# Patient Record
Sex: Female | Born: 1935 | Race: White | Hispanic: No | Smoking: Former smoker
Health system: Southern US, Community
[De-identification: ages and names within clinical notes are randomized; demographics above are authoritative.]

## PROBLEM LIST (undated history)

## (undated) DIAGNOSIS — K219 Gastro-esophageal reflux disease without esophagitis: Secondary | ICD-10-CM

## (undated) DIAGNOSIS — I10 Essential (primary) hypertension: Secondary | ICD-10-CM

## (undated) DIAGNOSIS — F0390 Unspecified dementia without behavioral disturbance: Secondary | ICD-10-CM

## (undated) DIAGNOSIS — I639 Cerebral infarction, unspecified: Secondary | ICD-10-CM

## (undated) DIAGNOSIS — J449 Chronic obstructive pulmonary disease, unspecified: Secondary | ICD-10-CM

## (undated) DIAGNOSIS — I2510 Atherosclerotic heart disease of native coronary artery without angina pectoris: Secondary | ICD-10-CM

## (undated) HISTORY — PX: CARDIAC DEFIBRILLATOR PLACEMENT: SHX171

## (undated) HISTORY — PX: CATARACT EXTRACTION: SUR2

## (undated) HISTORY — PX: A-V CARDIAC PACEMAKER INSERTION: SHX562

## (undated) HISTORY — PX: JOINT REPLACEMENT: SHX530

---

## 2011-10-18 ENCOUNTER — Emergency Department (INDEPENDENT_AMBULATORY_CARE_PROVIDER_SITE_OTHER): Payer: Medicare Other

## 2011-10-18 ENCOUNTER — Emergency Department (HOSPITAL_BASED_OUTPATIENT_CLINIC_OR_DEPARTMENT_OTHER)
Admission: EM | Admit: 2011-10-18 | Discharge: 2011-10-18 | Disposition: A | Discharge status: Other Acute Inpatient Hospital | Payer: Medicare Other | Attending: Emergency Medicine | Admitting: Emergency Medicine

## 2011-10-18 ENCOUNTER — Encounter (HOSPITAL_BASED_OUTPATIENT_CLINIC_OR_DEPARTMENT_OTHER): Payer: Self-pay | Admitting: *Deleted

## 2011-10-18 DIAGNOSIS — R0609 Other forms of dyspnea: Secondary | ICD-10-CM | POA: Insufficient documentation

## 2011-10-18 DIAGNOSIS — R0602 Shortness of breath: Secondary | ICD-10-CM

## 2011-10-18 DIAGNOSIS — Z950 Presence of cardiac pacemaker: Secondary | ICD-10-CM | POA: Insufficient documentation

## 2011-10-18 DIAGNOSIS — J4489 Other specified chronic obstructive pulmonary disease: Secondary | ICD-10-CM | POA: Insufficient documentation

## 2011-10-18 DIAGNOSIS — J449 Chronic obstructive pulmonary disease, unspecified: Secondary | ICD-10-CM | POA: Insufficient documentation

## 2011-10-18 DIAGNOSIS — Z8673 Personal history of transient ischemic attack (TIA), and cerebral infarction without residual deficits: Secondary | ICD-10-CM | POA: Insufficient documentation

## 2011-10-18 DIAGNOSIS — R079 Chest pain, unspecified: Secondary | ICD-10-CM

## 2011-10-18 DIAGNOSIS — I2510 Atherosclerotic heart disease of native coronary artery without angina pectoris: Secondary | ICD-10-CM | POA: Insufficient documentation

## 2011-10-18 DIAGNOSIS — R55 Syncope and collapse: Secondary | ICD-10-CM | POA: Insufficient documentation

## 2011-10-18 DIAGNOSIS — R0989 Other specified symptoms and signs involving the circulatory and respiratory systems: Secondary | ICD-10-CM | POA: Insufficient documentation

## 2011-10-18 DIAGNOSIS — J438 Other emphysema: Secondary | ICD-10-CM

## 2011-10-18 DIAGNOSIS — I10 Essential (primary) hypertension: Secondary | ICD-10-CM | POA: Insufficient documentation

## 2011-10-18 DIAGNOSIS — I517 Cardiomegaly: Secondary | ICD-10-CM

## 2011-10-18 HISTORY — DX: Atherosclerotic heart disease of native coronary artery without angina pectoris: I25.10

## 2011-10-18 HISTORY — DX: Gastro-esophageal reflux disease without esophagitis: K21.9

## 2011-10-18 HISTORY — DX: Chronic obstructive pulmonary disease, unspecified: J44.9

## 2011-10-18 HISTORY — DX: Essential (primary) hypertension: I10

## 2011-10-18 HISTORY — DX: Cerebral infarction, unspecified: I63.9

## 2011-10-18 HISTORY — DX: Unspecified dementia, unspecified severity, without behavioral disturbance, psychotic disturbance, mood disturbance, and anxiety: F03.90

## 2011-10-18 LAB — COMPREHENSIVE METABOLIC PANEL
ALT: 11 U/L (ref 0–35)
AST: 22 U/L (ref 0–37)
Alkaline Phosphatase: 114 U/L (ref 39–117)
CO2: 26 mEq/L (ref 19–32)
Calcium: 9.4 mg/dL (ref 8.4–10.5)
Chloride: 104 mEq/L (ref 96–112)
GFR calc non Af Amer: 78 mL/min — ABNORMAL LOW (ref >90)
Glucose, Bld: 112 mg/dL — ABNORMAL HIGH (ref 70–99)
Potassium: 3.9 mEq/L (ref 3.5–5.1)
Sodium: 141 mEq/L (ref 135–145)

## 2011-10-18 LAB — CBC
Hemoglobin: 14.6 g/dL (ref 12.0–15.0)
MCH: 32.1 pg (ref 26.0–34.0)
Platelets: 149 10*3/uL — ABNORMAL LOW (ref 150–400)
RBC: 4.55 MIL/uL (ref 3.87–5.11)
WBC: 7.6 10*3/uL (ref 4.0–10.5)

## 2011-10-18 NOTE — ED Notes (Signed)
Report given to [employer] RN HP

## 2011-10-18 NOTE — ED Notes (Signed)
Patient has mild dementia.  Patient and son states patient developed intermittent chest pain "some time during the night".  Hx of cardiac disease.  Son states that the patient is on medications, but forgets to take on a regular basis.  Denies nausea, sweating or radiation of her pain.  States when she takes a deep breath, her pain increases.  Pt has hx of copd.

## 2011-10-18 NOTE — ED Provider Notes (Signed)
History     CSN: [id number]  Arrival date & time 10/18/11  1228   First MD Initiated Contact with Patient 10/18/11 1311      Chief Complaint  Patient presents with  . Chest Pain    (Consider location/radiation/quality/duration/timing/severity/associated sxs/prior treatment) Patient is a 78 y.o. female presenting with chest pain. The history is provided by the patient. No language interpreter was used.  Chest Pain The chest pain began 6 - 12 hours ago. Chest pain occurs intermittently. The chest pain is resolved. The pain is associated with breathing. At its most intense, the pain is at 6/10. The pain is currently at 6/10. The quality of the pain is described as aching. The pain does not radiate. Pertinent negatives for primary symptoms include no shortness of breath.  Associated symptoms include near-syncope. She tried nothing for the symptoms.  Her past medical history is significant for arrhythmia, hypertension and pacemaker.   Pt reports pain began last night.  Pt reports pain has continued.  No current pain  Past Medical History  Diagnosis Date  . Coronary artery disease   . Hypertension   . GERD (gastroesophageal reflux disease)   . COPD (chronic obstructive pulmonary disease)   . Stroke   . Dementia     Past Surgical History  Procedure Date  . Joint replacement   . Cataract extraction   . A-v cardiac pacemaker insertion   . Cardiac defibrillator placement     No family history on file.  History  Substance Use Topics  . Smoking status: Former Games developer  . Smokeless tobacco: Not on file  . Alcohol Use: No    OB History    Grav Para Term Preterm Abortions TAB SAB Ect Mult Living                  Review of Systems  Respiratory: Negative for shortness of breath.   Cardiovascular: Positive for chest pain and near-syncope.  All other systems reviewed and are negative.    Allergies  Review of patient's allergies indicates no known allergies.  Home  Medications   Current Outpatient Rx  Name Route Sig Dispense Refill  . AMLODIPINE BESYLATE 5 MG PO TABS Oral Take 5 mg by mouth daily.    . NEBIVOLOL HCL 5 MG PO TABS Oral Take 5 mg by mouth daily.    Marland Kitchen OMEPRAZOLE 20 MG PO CPDR Oral Take 20 mg by mouth daily.      BP 158/90  Pulse 70  Temp 98.2 F (36.8 C)  Resp 20  Ht 5\' 6"  (1.676 m)  Wt 160 lb (72.576 kg)  BMI 25.82 kg/m2  SpO2 92%  Physical Exam  Nursing note and vitals reviewed. Constitutional: She is oriented to person, place, and time. She appears well-developed and well-nourished.  HENT:  Head: Normocephalic.  Right Ear: External ear normal.  Mouth/Throat: Oropharynx is clear and moist.  Eyes: Conjunctivae and EOM are normal. Pupils are equal, round, and reactive to light.  Neck: Normal range of motion. Neck supple.  Cardiovascular: Normal rate and normal heart sounds.   Pulmonary/Chest: Effort normal.  Abdominal: Soft.  Musculoskeletal: Normal range of motion.  Neurological: She is alert and oriented to person, place, and time. She has normal reflexes.  Skin: Skin is warm.  Psychiatric: She has a normal mood and affect.    ED Course  Procedures (including critical care time)  Labs Reviewed  CBC - Abnormal; Notable for the following:    Platelets 149 (*)  All other components within normal limits  COMPREHENSIVE METABOLIC PANEL - Abnormal; Notable for the following:    Glucose, Bld 112 (*)    GFR calc non Af Amer 78 (*)    All other components within normal limits  TROPONIN I   No results found.   No diagnosis found.    MDM   Results for orders placed during the hospital encounter of 10/18/11  CBC      Component Value Range   WBC 7.6  4.0 - 10.5 (K/uL)   RBC 4.55  3.87 - 5.11 (MIL/uL)   Hemoglobin 14.6  12.0 - 15.0 (g/dL)   HCT 16.1  09.6 - 04.5 (%)   MCV 96.0  78.0 - 100.0 (fL)   MCH 32.1  26.0 - 34.0 (pg)   MCHC 33.4  30.0 - 36.0 (g/dL)   RDW 40.9  81.1 - 91.4 (%)   Platelets 149 (*) 150  - 400 (K/uL)  COMPREHENSIVE METABOLIC PANEL      Component Value Range   Sodium 141  135 - 145 (mEq/L)   Potassium 3.9  3.5 - 5.1 (mEq/L)   Chloride 104  96 - 112 (mEq/L)   CO2 26  19 - 32 (mEq/L)   Glucose, Bld 112 (*) 70 - 99 (mg/dL)   BUN 10  6 - 23 (mg/dL)   Creatinine, Ser 7.82  0.50 - 1.10 (mg/dL)   Calcium 9.4  8.4 - 95.6 (mg/dL)   Total Protein 7.1  6.0 - 8.3 (g/dL)   Albumin 3.8  3.5 - 5.2 (g/dL)   AST 22  0 - 37 (U/L)   ALT 11  0 - 35 (U/L)   Alkaline Phosphatase 114  39 - 117 (U/L)   Total Bilirubin 0.5  0.3 - 1.2 (mg/dL)   GFR calc non Af Amer 78 (*) >90 (mL/min)   GFR calc Af Amer >90  >90 (mL/min)  TROPONIN I      Component Value Range   Troponin I <0.30  <0.30 (ng/mL)  PRO B NATRIURETIC PEPTIDE      Component Value Range   Pro B Natriuretic peptide (BNP) 370.0  0 - 450 (pg/mL)   Dg Chest 2 View  10/18/2011  *RADIOLOGY REPORT*  Clinical Data: Chest pain.  Shortness of breath.  CHEST - 2 VIEW  Comparison: None available.  Findings: The heart is enlarged.  Moderate emphysema is noted. Elevation of the left hemidiaphragm is likely chronic.  No focal airspace disease is evident.  A superior endplate compression fracture of the upper lumbar spine is age indeterminate.  There appears to be sclerosis associated with retrolisthesis.  This is likely remote.  Degenerative changes are worse in the left than right shoulder.  The visualized soft tissues and bony thorax are otherwise unremarkable.  IMPRESSION: 1.  Cardiomegaly without failure. 2.  Moderate emphysema. 3.  No acute cardiopulmonary disease. 4.  Superior endplate compression fracture of L1 or L2 is likely remote.  Please see the above discussion.  Original Report Authenticated By: Jamesetta Orleans. MATTERN, M.D.    Date: 10/18/2011  Rate: 91  Rhythm: normal sinus rhythm  QRS Axis: normal  Intervals: normal  ST/T Wave abnormalities: normal  Conduction Disutrbances:none  Narrative Interpretation:   Old EKG Reviewed: none  available    I spoke to Dr. Maisie Fus with [facility] who will admit at [facility].   Lonia Skinner [place], [place] 10/18/11 1535

## 2011-10-23 NOTE — ED Provider Notes (Signed)
History/physical exam/procedure(s) were performed by non-physician practitioner and as supervising physician I was immediately available for consultation/collaboration. I have reviewed all notes and am in agreement with care and plan.   Hilario Quarry, MD 10/23/11 [phone]

## 2013-11-22 ENCOUNTER — Emergency Department (HOSPITAL_BASED_OUTPATIENT_CLINIC_OR_DEPARTMENT_OTHER): Payer: Medicare Other

## 2013-11-22 ENCOUNTER — Emergency Department (HOSPITAL_BASED_OUTPATIENT_CLINIC_OR_DEPARTMENT_OTHER)
Admission: EM | Admit: 2013-11-22 | Discharge: 2013-11-22 | Disposition: A | Discharge status: Other Acute Inpatient Hospital | Payer: Medicare Other | Attending: Emergency Medicine | Admitting: Emergency Medicine

## 2013-11-22 ENCOUNTER — Encounter (HOSPITAL_BASED_OUTPATIENT_CLINIC_OR_DEPARTMENT_OTHER): Payer: Self-pay | Admitting: Emergency Medicine

## 2013-11-22 DIAGNOSIS — Z95810 Presence of automatic (implantable) cardiac defibrillator: Secondary | ICD-10-CM | POA: Insufficient documentation

## 2013-11-22 DIAGNOSIS — Z8673 Personal history of transient ischemic attack (TIA), and cerebral infarction without residual deficits: Secondary | ICD-10-CM | POA: Insufficient documentation

## 2013-11-22 DIAGNOSIS — Y9289 Other specified places as the place of occurrence of the external cause: Secondary | ICD-10-CM | POA: Insufficient documentation

## 2013-11-22 DIAGNOSIS — Z87891 Personal history of nicotine dependence: Secondary | ICD-10-CM | POA: Insufficient documentation

## 2013-11-22 DIAGNOSIS — R778 Other specified abnormalities of plasma proteins: Secondary | ICD-10-CM

## 2013-11-22 DIAGNOSIS — IMO0002 Reserved for concepts with insufficient information to code with codable children: Secondary | ICD-10-CM | POA: Insufficient documentation

## 2013-11-22 DIAGNOSIS — Z950 Presence of cardiac pacemaker: Secondary | ICD-10-CM | POA: Insufficient documentation

## 2013-11-22 DIAGNOSIS — Z79899 Other long term (current) drug therapy: Secondary | ICD-10-CM | POA: Insufficient documentation

## 2013-11-22 DIAGNOSIS — W19XXXA Unspecified fall, initial encounter: Secondary | ICD-10-CM

## 2013-11-22 DIAGNOSIS — J449 Chronic obstructive pulmonary disease, unspecified: Secondary | ICD-10-CM | POA: Insufficient documentation

## 2013-11-22 DIAGNOSIS — K219 Gastro-esophageal reflux disease without esophagitis: Secondary | ICD-10-CM | POA: Insufficient documentation

## 2013-11-22 DIAGNOSIS — I10 Essential (primary) hypertension: Secondary | ICD-10-CM | POA: Insufficient documentation

## 2013-11-22 DIAGNOSIS — S12110A Anterior displaced Type II dens fracture, initial encounter for closed fracture: Secondary | ICD-10-CM

## 2013-11-22 DIAGNOSIS — Y9389 Activity, other specified: Secondary | ICD-10-CM | POA: Insufficient documentation

## 2013-11-22 DIAGNOSIS — S129XXA Fracture of neck, unspecified, initial encounter: Secondary | ICD-10-CM | POA: Insufficient documentation

## 2013-11-22 DIAGNOSIS — R7989 Other specified abnormal findings of blood chemistry: Secondary | ICD-10-CM | POA: Insufficient documentation

## 2013-11-22 DIAGNOSIS — J4489 Other specified chronic obstructive pulmonary disease: Secondary | ICD-10-CM | POA: Insufficient documentation

## 2013-11-22 DIAGNOSIS — R51 Headache: Secondary | ICD-10-CM | POA: Insufficient documentation

## 2013-11-22 DIAGNOSIS — W010XXA Fall on same level from slipping, tripping and stumbling without subsequent striking against object, initial encounter: Secondary | ICD-10-CM | POA: Insufficient documentation

## 2013-11-22 DIAGNOSIS — I2510 Atherosclerotic heart disease of native coronary artery without angina pectoris: Secondary | ICD-10-CM | POA: Insufficient documentation

## 2013-11-22 DIAGNOSIS — F0390 Unspecified dementia without behavioral disturbance: Secondary | ICD-10-CM | POA: Insufficient documentation

## 2013-11-22 LAB — COMPREHENSIVE METABOLIC PANEL
ALBUMIN: 3.8 g/dL (ref 3.5–5.2)
ALK PHOS: 94 U/L (ref 39–117)
ALT: 22 U/L (ref 0–35)
AST: 42 U/L — AB (ref 0–37)
BUN: 14 mg/dL (ref 6–23)
CALCIUM: 9.9 mg/dL (ref 8.4–10.5)
CO2: 25 mEq/L (ref 19–32)
Chloride: 104 mEq/L (ref 96–112)
Creatinine, Ser: 0.7 mg/dL (ref 0.50–1.10)
GFR calc Af Amer: 89 mL/min — ABNORMAL LOW (ref >90)
GFR calc non Af Amer: 77 mL/min — ABNORMAL LOW (ref >90)
GLUCOSE: 129 mg/dL — AB (ref 70–99)
POTASSIUM: 4.2 meq/L (ref 3.7–5.3)
SODIUM: 144 meq/L (ref 137–147)
TOTAL PROTEIN: 7 g/dL (ref 6.0–8.3)
Total Bilirubin: 1.7 mg/dL — ABNORMAL HIGH (ref 0.3–1.2)

## 2013-11-22 LAB — TROPONIN I: Troponin I: 0.33 ng/mL (ref <0.30)

## 2013-11-22 LAB — CBC WITH DIFFERENTIAL/PLATELET
BASOS PCT: 0 % (ref 0–1)
Basophils Absolute: 0 10*3/uL (ref 0.0–0.1)
EOS ABS: 0 10*3/uL (ref 0.0–0.7)
Eosinophils Relative: 0 % (ref 0–5)
HCT: 45.4 % (ref 36.0–46.0)
HEMOGLOBIN: 15.6 g/dL — AB (ref 12.0–15.0)
LYMPHS PCT: 4 % — AB (ref 12–46)
Lymphs Abs: 0.4 10*3/uL — ABNORMAL LOW (ref 0.7–4.0)
MCH: 34.4 pg — ABNORMAL HIGH (ref 26.0–34.0)
MCHC: 34.4 g/dL (ref 30.0–36.0)
MCV: 100.2 fL — ABNORMAL HIGH (ref 78.0–100.0)
Monocytes Absolute: 0.5 10*3/uL (ref 0.1–1.0)
Monocytes Relative: 5 % (ref 3–12)
NEUTROS PCT: 91 % — AB (ref 43–77)
Neutro Abs: 9.1 10*3/uL — ABNORMAL HIGH (ref 1.7–7.7)
PLATELETS: DECREASED 10*3/uL (ref 150–400)
RBC: 4.53 MIL/uL (ref 3.87–5.11)
RDW: 14.2 % (ref 11.5–15.5)
WBC: 10 10*3/uL (ref 4.0–10.5)

## 2013-11-22 LAB — CK: Total CK: 850 U/L — ABNORMAL HIGH (ref 7–177)

## 2013-11-22 MED ORDER — SODIUM CHLORIDE 0.9 % IV BOLUS (SEPSIS)
1000.0000 mL | Freq: Once | INTRAVENOUS | Status: AC
  Administered 2013-11-22: 1000 mL via INTRAVENOUS

## 2013-11-22 NOTE — ED Provider Notes (Signed)
CSN: [id number]     Arrival date & time 11/22/13  1701 History  This chart was scribed for Glynn Octave, MD by Shari Heritage, ED Scribe. The patient was seen in room MH01/MH01. Patient's care was started at 5:14 PM.    Chief Complaint  Patient presents with  . Fall    The history is provided by the patient. No language interpreter was used.    HPI Comments: Donna Dalton is a 80 y.o. female who presents to the Emergency Department from her assisted living facility via EMS complaining of an unwitnessed fall that occurred sometime within the last 1 day. Patient's son states that she was found on the floor earlier today in her room and nursing facility staff suspect she had been there for several hours. Patient states that she tripped and fell while trying to get into bed, but was unable to get back up. She is not sure whether she hit her head or not. Per son, patient did not call for help, and staff found her while they were making rounds. Prior to arrival, she was complaining of left shoulder soreness and mild sacral pain to her son. Son states that patient's current mental status is her baseline. Son further reports that in the last week he has noticed patient's coordination has been poorer than usual. Currently, patient denies chest pain, abdominal pain, hip pain, back pain, extremity, vomiting, diarrhea or other symptoms. Patient has a medical history of CVA, dementia, hypertension, coronary artery disease and COPD. Patient has had a cardiac defibrillator and pacemaker for the past 20 years, currently [employer].  PCP - Doreatha Martin  Past Medical History  Diagnosis Date  . Coronary artery disease   . Hypertension   . GERD (gastroesophageal reflux disease)   . COPD (chronic obstructive pulmonary disease)   . Stroke   . Dementia    Past Surgical History  Procedure Laterality Date  . Joint replacement    . Cataract extraction    . A-v cardiac pacemaker insertion    . Cardiac  defibrillator placement     No family history on file. History  Substance Use Topics  . Smoking status: Former Games developer  . Smokeless tobacco: Not on file  . Alcohol Use: No   OB History   Grav Para Term Preterm Abortions TAB SAB Ect Mult Living                 Review of Systems A complete 10 system review of systems was obtained and all systems are negative except as noted in the HPI and PMH.    Allergies  Review of patient's allergies indicates no known allergies.  Home Medications   Prior to Admission medications   Medication Sig Start Date End Date Taking? Authorizing Provider  amLODipine (NORVASC) 5 MG tablet Take 5 mg by mouth daily.    Historical Provider, MD  nebivolol (BYSTOLIC) 5 MG tablet Take 5 mg by mouth daily.    Historical Provider, MD  omeprazole (PRILOSEC) 20 MG capsule Take 20 mg by mouth daily.    Historical Provider, MD   Triage Vitals: BP 154/75  Pulse 85  Temp(Src) 99.8 F (37.7 C) (Oral)  Resp 22  SpO2 90% Physical Exam  Nursing note and vitals reviewed. Constitutional: She appears well-developed and well-nourished. No distress.  HENT:  Head: Normocephalic and atraumatic.  Eyes: Conjunctivae and EOM are normal.  Neck: Normal range of motion. Neck supple. No tracheal deviation present.  Cardiovascular: Normal rate, regular rhythm and  normal heart sounds.  Exam reveals no gallop and no friction rub.   No murmur heard. Pulmonary/Chest: Effort normal and breath sounds normal. No respiratory distress. She has no wheezes. She has no rales.  Abdominal: Soft. There is no tenderness. There is no rebound and no guarding.  Musculoskeletal:  No TL spine tenderness. Paraspinal cervical tenderness.  Abrasion to left shin. Full ROM of hips without pain.   Neurological: She is alert.  No ataxia on finger to nose bilaterally. No pronator drift. 5/5 strength throughout. CN 2-12 intact. Negative Romberg. Equal grip strength. Sensation intact. Oriented x 2.    Skin: Skin is warm and dry.  Psychiatric: She has a normal mood and affect. Her behavior is normal.    ED Course  Procedures (including critical care time)  COORDINATION OF CARE: 5:32 PM- Will order x-ray of left shoulder, chest, pelvis and CTs of head and cervical spine; and CK UA, CBC with diff, CMP, and troponin. Patient informed of current plan for treatment and evaluation and agrees with plan at this time.  7:07 PM- Per [employer] representative, Vf that pace terminated on 09/19/13. VT nonsustained earlier in March, none recently, 4-20 beats no shocks.  7:12 PM - Spoke with Dr. Eden Emms, hospitalist on call, who has agreed to admit patient at [facility]    Results for orders placed during the hospital encounter of 11/22/13  CBC WITH DIFFERENTIAL      Result Value Ref Range   WBC 10.0  4.0 - 10.5 K/uL   RBC 4.53  3.87 - 5.11 MIL/uL   Hemoglobin 15.6 (*) 12.0 - 15.0 g/dL   HCT 29.5  62.1 - 30.8 %   MCV 100.2 (*) 78.0 - 100.0 fL   MCH 34.4 (*) 26.0 - 34.0 pg   MCHC 34.4  30.0 - 36.0 g/dL   RDW 65.7  84.6 - 96.2 %   Platelets PLATELETS APPEAR DECREASED  150 - 400 K/uL   Neutrophils Relative % 91 (*) 43 - 77 %   Lymphocytes Relative 4 (*) 12 - 46 %   Monocytes Relative 5  3 - 12 %   Eosinophils Relative 0  0 - 5 %   Basophils Relative 0  0 - 1 %   Neutro Abs 9.1 (*) 1.7 - 7.7 K/uL   Lymphs Abs 0.4 (*) 0.7 - 4.0 K/uL   Monocytes Absolute 0.5  0.1 - 1.0 K/uL   Eosinophils Absolute 0.0  0.0 - 0.7 K/uL   Basophils Absolute 0.0  0.0 - 0.1 K/uL   Smear Review LARGE PLATELETS PRESENT    COMPREHENSIVE METABOLIC PANEL      Result Value Ref Range   Sodium 144  137 - 147 mEq/L   Potassium 4.2  3.7 - 5.3 mEq/L   Chloride 104  96 - 112 mEq/L   CO2 25  19 - 32 mEq/L   Glucose, Bld 129 (*) 70 - 99 mg/dL   BUN 14  6 - 23 mg/dL   Creatinine, Ser 9.52  0.50 - 1.10 mg/dL   Calcium 9.9  8.4 - 84.1 mg/dL   Total Protein 7.0  6.0 - 8.3 g/dL   Albumin 3.8  3.5 - 5.2 g/dL    AST 42 (*) 0 - 37 U/L   ALT 22  0 - 35 U/L   Alkaline Phosphatase 94  39 - 117 U/L   Total Bilirubin 1.7 (*) 0.3 - 1.2 mg/dL   GFR calc non Af Amer 77 (*) >  90 mL/min   GFR calc Af Amer 89 (*) >90 mL/min  TROPONIN I      Result Value Ref Range   Troponin I 0.33 (*) <0.30 ng/mL  CK      Result Value Ref Range   Total CK 850 (*) 7 - 177 U/L    Imaging Review Dg Chest 1 View  11/22/2013   CLINICAL DATA:  Fall  EXAM: CHEST - 1 VIEW  COMPARISON:  10/18/2011  FINDINGS: Left-sided pacemaker overlies normal cardiac silhouette. Lungs are hyperinflated. There is pulmonary scarring at the lung apices. . Hiatal hernia noted.  IMPRESSION: 1.  No acute cardiopulmonary process.  . 2. Emphysema with upper lobe pulmonary scarring.  .   Electronically Signed   By: Genevive BiStewart  Edmunds M.D.   On: 11/22/2013 18:57   Dg Pelvis 1-2 Views  11/22/2013   CLINICAL DATA:  Fall  EXAM: PELVIS - 1-2 VIEW  COMPARISON:  None.  FINDINGS: Generalized osteopenia. No fracture or dislocation. Degenerative changes of bilateral SI joints and lower lumbar spine.  Sclerotic lesion in the left greater trochanter likely reflecting a bone island in the absence of known malignancy.  IMPRESSION: No acute osseous injury of the pelvis.   Electronically Signed   By: Elige KoHetal  Patel   On: 11/22/2013 18:42   Ct Head Wo Contrast  11/22/2013   ADDENDUM REPORT: 11/22/2013 19:46  ADDENDUM: Not mentioned in the initial report, there mild bilateral lung apical pleural/parenchymal irregular thickening, likely not of acute significance. It appears similar to prior CT scan of the thorax performed 08/28/12.   Electronically Signed   By: Esperanza Heiraymond  Rubner M.D.   On: 11/22/2013 19:46   11/22/2013   CLINICAL DATA:  Larey SeatFell last night, complaining of head pain and neck pain  EXAM: CT HEAD WITHOUT CONTRAST  CT CERVICAL SPINE WITHOUT CONTRAST  TECHNIQUE: Multidetector CT imaging of the head and cervical spine was performed following the standard protocol without intravenous  contrast. Multiplanar CT image reconstructions of the cervical spine were also generated.  COMPARISON:  None.  FINDINGS: CT HEAD FINDINGS  Moderate atrophy and low attenuation in the deep white matter consistent with chronic small vessel ischemic change. No evidence of mass or vascular territory infarct. No hydrocephalus. No hemorrhage or extra-axial fluid. Calvarium is intact.  CT CERVICAL SPINE FINDINGS  Levoscoliosis. Normal anterior-posterior alignment. 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 Mild degenerative disc disease at C3-4 and C4-5. Moderate to severe C5/6 and C6-7 degenerative disc disease. No prevertebral soft tissue swelling. There is a hairline fracture through the left side base of the odontoid process with no displacement.  IMPRESSION: 1. No acute intracranial abnormality 2. Hairline nondisplaced fracture through the odontoid process (type 2). Critical Value/emergent results were called by telephone at the time of interpretation on 11/22/2013 at 6:32 PM to Dr. Glynn OctaveSTEPHEN Deneshia Zucker , who verbally acknowledged these results.  Electronically Signed: By: Esperanza Heiraymond  Rubner M.D. On: 11/22/2013 18:32   Ct Cervical Spine Wo Contrast  11/22/2013   ADDENDUM REPORT: 11/22/2013 19:46  ADDENDUM: Not mentioned in the initial report, there mild bilateral lung apical pleural/parenchymal irregular thickening, likely not of acute significance. It appears similar to prior CT scan of the thorax performed 08/28/12.   Electronically Signed   By: Esperanza Heiraymond  Rubner M.D.   On: 11/22/2013 19:46   11/22/2013   CLINICAL DATA:  Larey SeatFell last night, complaining of head pain and neck pain  EXAM: CT HEAD WITHOUT CONTRAST  CT CERVICAL SPINE WITHOUT  CONTRAST  TECHNIQUE: Multidetector CT imaging of the head and cervical spine was performed following the standard protocol without intravenous contrast. Multiplanar CT image reconstructions of the cervical spine were also generated.  COMPARISON:  None.  FINDINGS: CT HEAD  FINDINGS  Moderate atrophy and low attenuation in the deep white matter consistent with chronic small vessel ischemic change. No evidence of mass or vascular territory infarct. No hydrocephalus. No hemorrhage or extra-axial fluid. Calvarium is intact.  CT CERVICAL SPINE FINDINGS  Levoscoliosis. Normal anterior-posterior alignment. 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 Mild degenerative disc disease at C3-4 and C4-5. Moderate to severe C5/6 and C6-7 degenerative disc disease. No prevertebral soft tissue swelling. There is a hairline fracture through the left side base of the odontoid process with no displacement.  IMPRESSION: 1. No acute intracranial abnormality 2. Hairline nondisplaced fracture through the odontoid process (type 2). Critical Value/emergent results were called by telephone at the time of interpretation on 11/22/2013 at 6:32 PM to Dr. Glynn Octave , who verbally acknowledged these results.  Electronically Signed: By: Esperanza Heir M.D. On: 11/22/2013 18:32   Dg Shoulder Left  11/22/2013   CLINICAL DATA:  Status post fall  EXAM: LEFT SHOULDER - 2+ VIEW  COMPARISON:  None.  FINDINGS: No fracture or dislocation. Mild degenerative changes of the glenohumeral joint and acromioclavicular joint. Small loose body in the axillary pouch of the glenohumeral joint.  IMPRESSION: No acute osseous injury of the left shoulder.   Electronically Signed   By: Elige Ko   On: 11/22/2013 18:44      EKG Interpretation   Date/Time:  Saturday Nov 22 2013 17:29:36 EDT Ventricular Rate:  79 PR Interval:  216 QRS Duration: 142 QT Interval:  460 QTC Calculation: 527 R Axis:   30 Text Interpretation:  Atrial-sensed ventricular-paced rhythm with  prolonged AV conduction Biventricular pacemaker detected Abnormal ECG No  significant change was found Confirmed by Manus Gunning  MD, Deral Schellenberg [phone]) on  11/22/2013 5:51:12 PM      MDM   Final diagnoses:  Fall  Odontoid fracture   Elevated troponin    Patient apparently had a fall last night and went ground all night long. She was found by her apartment manager today. She thinks she tripped and fell. She is oriented x2. She denies any pain currently.  Cervical collar placed. Patient found to have negative head CT. CT C-spine shows hairline fracture through odontoid process  Troponin positive at 0.33. EKG is paced. Patient denies any chest pain. AICD interrogation is not show any recent episodes of arrhythmia.  Odontoid fracture discussed with Dr. Franky Macho who agrees with hard cervical collar and followup in clinic. Patient remains neurologically intact.  Suspect troponin leak from mild rhabdomyolysis and dehydration. CK 850.  IVF given. The patient denies any chest pain. EKG is unchanged. Admission discussed with Dr. Eden Emms at [facility]  BP 156/59  Pulse 88  Temp(Src) 99.8 F (37.7 C) (Oral)  Resp 20  SpO2 100%   CRITICAL CARE Performed by: Glynn Octave Total critical care time: 30 Critical care time was exclusive of separately billable procedures and treating other patients. Critical care was necessary to treat or prevent imminent or life-threatening deterioration. Critical care was time spent personally by me on the following activities: development of treatment plan with patient and/or surrogate as well as nursing, discussions with consultants, evaluation of patient's response to treatment, examination of patient, obtaining history from patient or surrogate, ordering and performing treatments  and interventions, ordering and review of laboratory studies, ordering and review of radiographic studies, pulse oximetry and re-evaluation of patient's condition.   I personally performed the services described in this documentation, which was scribed in my presence. The recorded information has been reviewed and is accurate.   Glynn Octave, MD 11/22/13 [phone]

## 2013-11-22 NOTE — ED Notes (Signed)
Contact mobile cell number for son [phone]

## 2013-11-22 NOTE — ED Notes (Addendum)
Per EMS, patient lives alone in an assisted living and was found on the floor today, patient oriented to self only, but she thinks she fell last night. C/O  Left shoulder and lower back pain, cbg 121 on transport

## 2014-01-05 DIAGNOSIS — K219 Gastro-esophageal reflux disease without esophagitis: Secondary | ICD-10-CM | POA: Insufficient documentation

## 2014-01-05 DIAGNOSIS — Z8673 Personal history of transient ischemic attack (TIA), and cerebral infarction without residual deficits: Secondary | ICD-10-CM | POA: Insufficient documentation

## 2014-01-05 DIAGNOSIS — I2510 Atherosclerotic heart disease of native coronary artery without angina pectoris: Secondary | ICD-10-CM | POA: Insufficient documentation

## 2014-01-05 DIAGNOSIS — R413 Other amnesia: Secondary | ICD-10-CM | POA: Insufficient documentation

## 2014-01-05 DIAGNOSIS — J449 Chronic obstructive pulmonary disease, unspecified: Secondary | ICD-10-CM | POA: Insufficient documentation

## 2014-01-05 DIAGNOSIS — Z95810 Presence of automatic (implantable) cardiac defibrillator: Secondary | ICD-10-CM | POA: Insufficient documentation

## 2014-01-05 DIAGNOSIS — I10 Essential (primary) hypertension: Secondary | ICD-10-CM | POA: Insufficient documentation

## 2015-01-18 DIAGNOSIS — R7303 Prediabetes: Secondary | ICD-10-CM | POA: Insufficient documentation

## 2015-08-12 ENCOUNTER — Emergency Department (HOSPITAL_BASED_OUTPATIENT_CLINIC_OR_DEPARTMENT_OTHER): Payer: Medicare Other

## 2015-08-12 ENCOUNTER — Encounter (HOSPITAL_BASED_OUTPATIENT_CLINIC_OR_DEPARTMENT_OTHER): Payer: Self-pay | Admitting: Emergency Medicine

## 2015-08-12 ENCOUNTER — Emergency Department (HOSPITAL_BASED_OUTPATIENT_CLINIC_OR_DEPARTMENT_OTHER)
Admission: EM | Admit: 2015-08-12 | Discharge: 2015-08-12 | Disposition: A | Discharge status: Home / Self Care | Payer: Medicare Other | Attending: Emergency Medicine | Admitting: Emergency Medicine

## 2015-08-12 DIAGNOSIS — Z95810 Presence of automatic (implantable) cardiac defibrillator: Secondary | ICD-10-CM | POA: Diagnosis not present

## 2015-08-12 DIAGNOSIS — J449 Chronic obstructive pulmonary disease, unspecified: Secondary | ICD-10-CM | POA: Diagnosis not present

## 2015-08-12 DIAGNOSIS — S52591A Other fractures of lower end of right radius, initial encounter for closed fracture: Secondary | ICD-10-CM | POA: Diagnosis not present

## 2015-08-12 DIAGNOSIS — Z79899 Other long term (current) drug therapy: Secondary | ICD-10-CM | POA: Diagnosis not present

## 2015-08-12 DIAGNOSIS — Y939 Activity, unspecified: Secondary | ICD-10-CM | POA: Insufficient documentation

## 2015-08-12 DIAGNOSIS — Z7982 Long term (current) use of aspirin: Secondary | ICD-10-CM | POA: Insufficient documentation

## 2015-08-12 DIAGNOSIS — I10 Essential (primary) hypertension: Secondary | ICD-10-CM | POA: Insufficient documentation

## 2015-08-12 DIAGNOSIS — I2510 Atherosclerotic heart disease of native coronary artery without angina pectoris: Secondary | ICD-10-CM | POA: Diagnosis not present

## 2015-08-12 DIAGNOSIS — Z8673 Personal history of transient ischemic attack (TIA), and cerebral infarction without residual deficits: Secondary | ICD-10-CM | POA: Diagnosis not present

## 2015-08-12 DIAGNOSIS — S60211A Contusion of right wrist, initial encounter: Secondary | ICD-10-CM | POA: Insufficient documentation

## 2015-08-12 DIAGNOSIS — F0390 Unspecified dementia without behavioral disturbance: Secondary | ICD-10-CM | POA: Diagnosis not present

## 2015-08-12 DIAGNOSIS — S52501A Unspecified fracture of the lower end of right radius, initial encounter for closed fracture: Secondary | ICD-10-CM

## 2015-08-12 DIAGNOSIS — K219 Gastro-esophageal reflux disease without esophagitis: Secondary | ICD-10-CM | POA: Diagnosis not present

## 2015-08-12 DIAGNOSIS — W19XXXA Unspecified fall, initial encounter: Secondary | ICD-10-CM | POA: Diagnosis not present

## 2015-08-12 DIAGNOSIS — S6991XA Unspecified injury of right wrist, hand and finger(s), initial encounter: Secondary | ICD-10-CM | POA: Diagnosis present

## 2015-08-12 DIAGNOSIS — Y92009 Unspecified place in unspecified non-institutional (private) residence as the place of occurrence of the external cause: Secondary | ICD-10-CM | POA: Diagnosis not present

## 2015-08-12 DIAGNOSIS — Y998 Other external cause status: Secondary | ICD-10-CM | POA: Diagnosis not present

## 2015-08-12 DIAGNOSIS — Z87891 Personal history of nicotine dependence: Secondary | ICD-10-CM | POA: Insufficient documentation

## 2015-08-12 LAB — TROPONIN I
TROPONIN I: 0.08 ng/mL — AB (ref <0.031)
Troponin I: 0.08 ng/mL — ABNORMAL HIGH (ref <0.031)

## 2015-08-12 LAB — COMPREHENSIVE METABOLIC PANEL
ALK PHOS: 92 U/L (ref 38–126)
ALT: 16 U/L (ref 14–54)
AST: 31 U/L (ref 15–41)
Albumin: 4 g/dL (ref 3.5–5.0)
Anion gap: 9 (ref 5–15)
BUN: 18 mg/dL (ref 6–20)
CALCIUM: 9 mg/dL (ref 8.9–10.3)
CHLORIDE: 105 mmol/L (ref 101–111)
CO2: 27 mmol/L (ref 22–32)
CREATININE: 0.83 mg/dL (ref 0.44–1.00)
GFR calc Af Amer: >60 mL/min (ref >60)
Glucose, Bld: 201 mg/dL — ABNORMAL HIGH (ref 65–99)
Potassium: 3.8 mmol/L (ref 3.5–5.1)
Sodium: 141 mmol/L (ref 135–145)
Total Bilirubin: 1.1 mg/dL (ref 0.3–1.2)
Total Protein: 7.3 g/dL (ref 6.5–8.1)

## 2015-08-12 LAB — PROTIME-INR
INR: 1.06 (ref 0.00–1.49)
PROTHROMBIN TIME: 14 s (ref 11.6–15.2)

## 2015-08-12 LAB — CBC
HCT: 44.1 % (ref 36.0–46.0)
Hemoglobin: 13.9 g/dL (ref 12.0–15.0)
MCH: 31.7 pg (ref 26.0–34.0)
MCHC: 31.5 g/dL (ref 30.0–36.0)
MCV: 100.7 fL — AB (ref 78.0–100.0)
PLATELETS: 105 10*3/uL — AB (ref 150–400)
RBC: 4.38 MIL/uL (ref 3.87–5.11)
RDW: 15 % (ref 11.5–15.5)
WBC: 7.5 10*3/uL (ref 4.0–10.5)

## 2015-08-12 LAB — APTT: aPTT: 34 seconds (ref 24–37)

## 2015-08-12 LAB — CK: Total CK: 71 U/L (ref 38–234)

## 2015-08-12 NOTE — ED Notes (Signed)
Pt attempting to unwrap splint and get out of bed.  States she is ready to go home.  Reoriented patient to her broken arm and need to stay in bed in room until she is discharged and her son returns.  Patient pleasant and cooperative.  John, Pensions consultant, reapplied splint wrapping.  Toileting offered. Patient repositioned in bed.

## 2015-08-12 NOTE — ED Notes (Addendum)
Pt fell 2 nights ago after unknown event, found yesterday morning by nursing staff on floor at home.  Pt remained on floor for unknown period of time throughout the night.  Pt checked by EMS and given okay to not come to hospital.  Since yesterday, bruising to right wrist and lower arm has increased and pt c/o sternal chest pain and right upper arm pain.  Per son, patient ROM limited in right arm since fall.

## 2015-08-12 NOTE — ED Provider Notes (Addendum)
CSN: [id number]     Arrival date & time 08/12/15  1346 History   First MD Initiated Contact with Patient 08/12/15 1502     Chief Complaint  Patient presents with  . Fall     (Consider location/radiation/quality/duration/timing/severity/associated sxs/prior Treatment) HPI Comments: Patient is an 82 year old female with an unwitnessed fall 2 nights ago for an unknown known cause and was found in the morning by nursing staff. Patient has a history of dementia and cannot recall why she fell. She does not know how long she was on the floor but since that time they have noticed worsening swelling and bruising to her right wrist and lower arm as well as some complaint of pain in her right upper arm. She has had a cough that's developed over the last 2 days but she denies any shortness of breath and person she is supposed to be wearing oxygen on a regular basis. Patient denies any shortness of breath, exertional chest pain or lower extremity pain. Patient does have a defibrillator but denies any discharges. EMS initially came out and felt that patient did not need to go to the hospital however given the increasing bruising and complaint of right wrist pain they center for further evaluation. Patient denies taking any anticoagulation other than aspirin and denies hitting her head. She has unknown LOC. She denies any neck or back pain.  Patient is a 82 y.o. female presenting with fall. The history is provided by the patient and a relative.  Fall    Past Medical History  Diagnosis Date  . Coronary artery disease   . Hypertension   . GERD (gastroesophageal reflux disease)   . COPD (chronic obstructive pulmonary disease) (HCC)   . Stroke (HCC)   . Dementia    Past Surgical History  Procedure Laterality Date  . Joint replacement    . Cataract extraction    . A-v cardiac pacemaker insertion    . Cardiac defibrillator placement     History reviewed. No pertinent family history. Social History    Substance Use Topics  . Smoking status: Former Games developer  . Smokeless tobacco: None  . Alcohol Use: No   OB History    No data available     Review of Systems  All other systems reviewed and are negative.     Allergies  Review of patient's allergies indicates no known allergies.  Home Medications   Prior to Admission medications   Medication Sig Start Date End Date Taking? Authorizing Provider  amLODipine (NORVASC) 5 MG tablet Take 5 mg by mouth daily.   Yes Historical Provider, MD  aspirin 81 MG chewable tablet Chew 81 mg by mouth daily.   Yes Historical Provider, MD  Cholecalciferol (VITAMIN D PO) Take 50 mg by mouth once.   Yes Historical Provider, MD  nebivolol (BYSTOLIC) 2.5 MG tablet Take 2.5 mg by mouth daily.   Yes Historical Provider, MD  omeprazole (PRILOSEC) 20 MG capsule Take 20 mg by mouth daily.   Yes Historical Provider, MD   BP 151/66 mmHg  Pulse 75  Temp(Src) 98.2 F (36.8 C) (Oral)  Resp 24  SpO2 95% Physical Exam  Constitutional: She is oriented to person, place, and time. She appears well-developed and well-nourished. No distress.  HENT:  Head: Normocephalic and atraumatic.  Mouth/Throat: Oropharynx is clear and moist.  Eyes: Conjunctivae and EOM are normal. Pupils are equal, round, and reactive to light.  Neck: Normal range of motion. Neck supple.  Cardiovascular: Normal rate, regular  rhythm and intact distal pulses.   No murmur heard. Pulmonary/Chest: Effort normal and breath sounds normal. No respiratory distress. She has no wheezes. She has no rales.  Occasional wet sounding cough  Abdominal: Soft. She exhibits no distension. There is no tenderness. There is no rebound and no guarding.  Musculoskeletal: Normal range of motion. She exhibits tenderness. She exhibits no edema.       Right wrist: She exhibits tenderness and swelling. She exhibits no deformity.       Back:  Tenderness most pronounced over the distal right wrist with significant  ecchymosis and swelling. Normal movement of the fingers of the right hand and hand grip. Less than 3 second capillary refill. Kyphosis without cervical, thoracic or lumbar point tenderness.  Neurological: She is alert and oriented to person, place, and time.  Skin: Skin is warm and dry. No rash noted. No erythema.  Psychiatric: She has a normal mood and affect. Her behavior is normal.  Nursing note and vitals reviewed.   ED Course  Procedures (including critical care time) Labs Review Labs Reviewed  CBC - Abnormal; Notable for the following:    MCV 100.7 (*)    Platelets 105 (*)    All other components within normal limits  COMPREHENSIVE METABOLIC PANEL - Abnormal; Notable for the following:    Glucose, Bld 201 (*)    All other components within normal limits  TROPONIN I - Abnormal; Notable for the following:    Troponin I 0.08 (*)    All other components within normal limits  APTT  PROTIME-INR  CK    Imaging Review Dg Chest 2 View  08/12/2015  CLINICAL DATA:  Pt fell 2 nights ago after unknown event, found yesterday morning by nursing staff on floor at home. Pt remained on floor for unknown period of time throughout the night. Pt checked by EMS and given okay to not come to hospital. Since yesterday, bruising to right wrist and lower arm has increased and pt c/o sternal chest pain and right upper arm pain. Per son, patient ROM limited in right arm since fall. EXAM: CHEST  2 VIEW COMPARISON:  11/22/2013 FINDINGS: Cardiac silhouette is mildly enlarged. Left anterior chest wall biventricular cardioverter-defibrillator is stable. No mediastinal or hilar masses or evidence of adenopathy. Lungs are hyperexpanded. No evidence of pneumonia or edema. No pleural effusion or pneumothorax. Bony thorax is demineralized but grossly intact. IMPRESSION: No acute cardiopulmonary disease. Electronically Signed   By: Amie Portland M.D.   On: 08/12/2015 15:35   Dg Wrist Complete Right  08/12/2015   CLINICAL DATA:  Pain after fall EXAM: RIGHT WRIST - COMPLETE 3+ VIEW COMPARISON:  None. FINDINGS: There is a nondisplaced fracture through the distal radius. The ulna is normal in appearance. No wrist dislocation. The carpal bones and visualized metacarpal bones are intact. Degenerative changes are seen at the base of the first metacarpal. IMPRESSION: Nondisplaced subtle distal radius fracture. Electronically Signed   By: Gerome Sam III M.D   On: 08/12/2015 15:36   I have personally reviewed and evaluated these images and lab results as part of my medical decision-making.   EKG Interpretation   Date/Time:  Thursday August 12 2015 14:04:34 EST Ventricular Rate:  77 PR Interval:  207 QRS Duration: 159 QT Interval:  463 QTC Calculation: 524 R Axis:   -84 Text Interpretation:  VENTRICULAR PACED RHYTHM Confirmed by Judd Lien  MD,  DOUGLAS ([id number]) on 08/12/2015 3:08:53 PM      MDM  Final diagnoses:  Distal radius fracture, right, closed, initial encounter    Patient is an 82 year old female with an unwitnessed fall 2 nights ago at the nursing home and was sent here for further evaluation of pain in her wrist and bruising. Patient's only complaint is in pain in her right wrist. She does have a history of dementia and does not remember what caused her to fall 2 nights ago. She laid on the floor for an unknown amount of time but is unsure she had loss of consciousness or syncope. Patient denies any chest pain or palpitations. She states she is sore all over but has not had any worsening shortness of breath. Son states she's had a mild wet cough today but other than being sore seems to be her normal self. She has not noticed any defibrillator fire and denies any recent medication changes.  Patient has significant bruising and tenderness to the distal wrist but otherwise moves all extremities without difficulty has no point tenderness vertebral pain and has no injury to the head or neck. A she  received a chest x-ray due to the wet cough and a wrist film. The x-ray was within normal limits and is most likely just related to some mild atelectasis and wrist film shows a nondisplaced subtle distal radial fracture. Patient was placed in a radial gutter splint.  Patient had labs done before I saw her and in triage a CBC, CMP, PT, troponin and CK were ordered. All of those tests were normal except for a minimal elevation in the troponin of 0.08. Patient has had troponin leaks in the past and she is not giving a story of ACS. We'll recheck her troponin and 3 hours to ensure no further elevation.  5:57 PM Pacemaker was interrogated yesterday and showed no shocks the leads were stable and no evidence of V. tach or dysrhythmias.  Patient's repeat troponin was unchanged and most likely just a chronic leak. At this time feel the patient is stable for discharge home. She will follow-up with Dr. Pearletha Forge for her wrist fracture.  Gwyneth Sprout, MD 08/12/15 1757  Gwyneth Sprout, MD 08/12/15 1610

## 2015-08-12 NOTE — Discharge Instructions (Signed)
Forearm Fracture °A forearm fracture is a break in one or both of the bones of your arm that are between the elbow and the wrist. Your forearm is made up of two bones: °· Radius. This is the bone on the inside of your arm near your thumb. °· Ulna. This is the bone on the outside of your arm near your little finger. °Middle forearm fractures usually break both the radius and the ulna. Most forearm fractures that involve both the ulna and radius will require surgery. °CAUSES °Common causes of this type of fracture include: °· Falling on an outstretched arm. °· Accidents, such as a car or bike accident. °· A hard, direct hit to the middle part of your arm. °RISK FACTORS °You may be at higher risk for this type of fracture if: °· You play contact sports. °· You have a condition that causes your bones to be weak or thin (osteoporosis). °SIGNS AND SYMPTOMS °A forearm fracture causes pain immediately after the injury. Other signs and symptoms include: °· An abnormal bend or bump in your arm (deformity). °· Swelling. °· Numbness or tingling. °· Tenderness. °· Inability to turn your hand from side to side (rotate). °· Bruising. °DIAGNOSIS °Your health care provider may diagnose a forearm fracture based on: °· Your symptoms. °· Your medical history, including any recent injury. °· A physical exam. Your health care provider will look for any deformity and feel for tenderness over the break. Your health care provider will also check whether the bones are out of place. °· An X-ray exam to confirm the diagnosis and learn more about the type of fracture. °TREATMENT °The goals of treatment are to get the bone or bones in proper position for healing and to keep the bones from moving so they will heal over time. Your treatment will depend on many factors, especially the type of fracture that you have. °· If the fractured bone or bones: °¨ Are in the correct position (nondisplaced), you may only need to wear a cast or a  splint. °¨ Have a slightly displaced fracture, you may need to have the bones moved back into place manually (closed reduction) before the splint or cast is put on. °· You may have a temporary splint before you have a cast. The splint allows room for some swelling. After a few days, a cast can replace the splint. °· You may have to wear the cast for 6-8 weeks or as directed by your health care provider. °· The cast may be changed after about 3 weeks or as directed by your health care provider. °· After your cast is removed, you may need physical therapy to regain full movement in your wrist or elbow. °· You may need emergency surgery if you have: °¨ A fractured bone or bones that are out of position (displaced). °¨ A fracture with multiple fragments (comminuted fracture). °¨ A fracture that breaks the skin (open fracture). This type of fracture may require surgical wires, plates, or screws to hold the bone or bones in place. °· You may have X-rays every couple of weeks to check on your healing. °HOME CARE INSTRUCTIONS °If You Have a Cast: °· Do not stick anything inside the cast to scratch your skin. Doing that increases your risk of infection. °· Check the skin around the cast every day. Report any concerns to your health care provider. You may put lotion on dry skin around the edges of the cast. Do not apply lotion to the skin   underneath the cast. °If You Have a Splint: °· Wear it as directed by your health care provider. Remove it only as directed by your health care provider. °· Loosen the splint if your fingers become numb and tingle, or if they turn cold and blue. °Bathing °· Cover the cast or splint with a watertight plastic bag to protect it from water while you bathe or shower. Do not let the cast or splint get wet. °Managing Pain, Stiffness, and Swelling °· If directed, apply ice to the injured area: °¨ Put ice in a plastic bag. °¨ Place a towel between your skin and the bag. °¨ Leave the ice on for 20  minutes, 2-3 times a day. °· Move your fingers often to avoid stiffness and to lessen swelling. °· Raise the injured area above the level of your heart while you are sitting or lying down. °Driving °· Do not drive or operate heavy machinery while taking pain medicine. °· Do not drive while wearing a cast or splint on a hand that you use for driving. °Activity °· Return to your normal activities as directed by your health care provider. Ask your health care provider what activities are safe for you. °· Perform range-of-motion exercises only as directed by your health care provider. °Safety °· Do not use your injured limb to support your body weight until your health care provider says that you can. °General Instructions °· Do not put pressure on any part of the cast or splint until it is fully hardened. This may take several hours. °· Keep the cast or splint clean and dry. °· Do not use any tobacco products, including cigarettes, chewing tobacco, or electronic cigarettes. Tobacco can delay bone healing. If you need help quitting, ask your health care provider. °· Take medicines only as directed by your health care provider. °· Keep all follow-up visits as directed by your health care provider. This is important. °SEEK MEDICAL CARE IF: °· Your pain medicine is not helping. °· Your cast or splint becomes wet or damaged or suddenly feels too tight. °· Your cast becomes loose. °· You have more severe pain or swelling than you did before the cast. °· You have severe pain when you stretch your fingers. °· You continue to have pain or stiffness in your elbow or your wrist after your cast is removed. °SEEK IMMEDIATE MEDICAL CARE IF: °· You cannot move your fingers. °· You lose feeling in your fingers or your hand. °· Your hand or your fingers turn cold and pale or blue. °· You notice a bad smell coming from your cast. °· You have drainage from underneath your cast. °· You have new stains from blood or drainage that is coming  through your cast. °  °This information is not intended to replace advice given to you by your health care provider. Make sure you discuss any questions you have with your health care provider. °  °Document Released: 06/09/2000 Document Revised: 07/03/2014 Document Reviewed: 01/26/2014 °Elsevier Interactive Patient Education ©2016 [employer] ° °

## 2015-08-12 NOTE — ED Notes (Signed)
Large purple bruising covering 75% of R. Lower arm and wrist.  Purple bruising noted to right lower lumbar/sacral back.  Pt c/o thoracic and lumbar back pain along vertebral line.  Pt denies abdominal pain, hip, leg or flank pain.

## 2015-08-12 NOTE — ED Notes (Addendum)
Per Ems: The patient fell at the Nursing home. The patient has bruising noted to her left antior  wrist and right complete wrist. The patient has chest pain when she takes a deep breath. The patient also has bruising to her right hip. The patient is awake alert and but has ST memory loss. Patient reports that she had pain all over.

## 2015-08-13 ENCOUNTER — Emergency Department (HOSPITAL_BASED_OUTPATIENT_CLINIC_OR_DEPARTMENT_OTHER)
Admission: EM | Admit: 2015-08-13 | Discharge: 2015-08-13 | Disposition: A | Discharge status: Home / Self Care | Payer: Medicare Other | Attending: Emergency Medicine | Admitting: Emergency Medicine

## 2015-08-13 ENCOUNTER — Encounter (HOSPITAL_BASED_OUTPATIENT_CLINIC_OR_DEPARTMENT_OTHER): Payer: Self-pay

## 2015-08-13 DIAGNOSIS — F0390 Unspecified dementia without behavioral disturbance: Secondary | ICD-10-CM | POA: Diagnosis not present

## 2015-08-13 DIAGNOSIS — Z87891 Personal history of nicotine dependence: Secondary | ICD-10-CM | POA: Diagnosis not present

## 2015-08-13 DIAGNOSIS — S52501D Unspecified fracture of the lower end of right radius, subsequent encounter for closed fracture with routine healing: Secondary | ICD-10-CM | POA: Diagnosis not present

## 2015-08-13 DIAGNOSIS — X58XXXD Exposure to other specified factors, subsequent encounter: Secondary | ICD-10-CM | POA: Insufficient documentation

## 2015-08-13 DIAGNOSIS — Z950 Presence of cardiac pacemaker: Secondary | ICD-10-CM | POA: Diagnosis not present

## 2015-08-13 DIAGNOSIS — K219 Gastro-esophageal reflux disease without esophagitis: Secondary | ICD-10-CM | POA: Diagnosis not present

## 2015-08-13 DIAGNOSIS — I2510 Atherosclerotic heart disease of native coronary artery without angina pectoris: Secondary | ICD-10-CM | POA: Insufficient documentation

## 2015-08-13 DIAGNOSIS — Z7982 Long term (current) use of aspirin: Secondary | ICD-10-CM | POA: Insufficient documentation

## 2015-08-13 DIAGNOSIS — Z79899 Other long term (current) drug therapy: Secondary | ICD-10-CM | POA: Insufficient documentation

## 2015-08-13 DIAGNOSIS — J449 Chronic obstructive pulmonary disease, unspecified: Secondary | ICD-10-CM | POA: Insufficient documentation

## 2015-08-13 DIAGNOSIS — Z8673 Personal history of transient ischemic attack (TIA), and cerebral infarction without residual deficits: Secondary | ICD-10-CM | POA: Diagnosis not present

## 2015-08-13 DIAGNOSIS — I10 Essential (primary) hypertension: Secondary | ICD-10-CM | POA: Diagnosis not present

## 2015-08-13 NOTE — ED Provider Notes (Signed)
CSN: [id number]     Arrival date & time 08/13/15  1446 History   First MD Initiated Contact with Patient 08/13/15 1507     Chief Complaint  Patient presents with  . Splint application     Level V caveat: dementia  HPI Presents to ER with request for right UE splint reapplication.  Seen yesterday and dx with a right distal radius fracture. At some point she removed the splint. She is unable to provide any hx given her dementia    Past Medical History  Diagnosis Date  . Coronary artery disease   . Hypertension   . GERD (gastroesophageal reflux disease)   . COPD (chronic obstructive pulmonary disease) (HCC)   . Stroke (HCC)   . Dementia    Past Surgical History  Procedure Laterality Date  . Joint replacement    . Cataract extraction    . A-v cardiac pacemaker insertion    . Cardiac defibrillator placement     No family history on file. Social History  Substance Use Topics  . Smoking status: Former Games developer  . Smokeless tobacco: None  . Alcohol Use: No   OB History    No data available     Review of Systems  Unable to perform ROS: Dementia      Allergies  Review of patient's allergies indicates no known allergies.  Home Medications   Prior to Admission medications   Medication Sig Start Date End Date Taking? Authorizing Provider  amLODipine (NORVASC) 5 MG tablet Take 5 mg by mouth daily.    Historical Provider, MD  aspirin 81 MG chewable tablet Chew 81 mg by mouth daily.    Historical Provider, MD  Cholecalciferol (VITAMIN D PO) Take 50 mg by mouth once.    Historical Provider, MD  nebivolol (BYSTOLIC) 2.5 MG tablet Take 2.5 mg by mouth daily.    Historical Provider, MD  omeprazole (PRILOSEC) 20 MG capsule Take 20 mg by mouth daily.    Historical Provider, MD   BP 144/70 mmHg  Pulse 70  Temp(Src) 98.6 F (37 C) (Oral)  Resp 24  SpO2 88% Physical Exam  Constitutional: She appears well-developed and well-nourished.  HENT:  Head: Normocephalic.  Eyes: EOM  are normal.  Neck: Normal range of motion.  Pulmonary/Chest: Effort normal.  Abdominal: She exhibits no distension.  Musculoskeletal: Normal range of motion.  brusied right UE. Normal right radial pulse. Some pain with ROM of right wrist  Neurological: She is alert.  Psychiatric: She has a normal mood and affect.  Nursing note and vitals reviewed.   ED Course  Procedures (including critical care time)  SPLINT APPLICATION Authorized by: Lyanne Co Consent: Verbal consent obtained. Risks and benefits: risks, benefits and alternatives were discussed Consent given by: patient Splint applied by: ER tech Location details: right UE Splint type: velcro thumb spica Supplies used: velcro thumb spica.  Post-procedure: The splinted body part was neurovascularly unchanged following the procedure. Patient tolerance: Patient tolerated the procedure well with no immediate complications.     Labs Review Labs Reviewed - No data to display    Dg Wrist Complete Right  08/12/2015  CLINICAL DATA:  Pain after fall EXAM: RIGHT WRIST - COMPLETE 3+ VIEW COMPARISON:  None. FINDINGS: There is a nondisplaced fracture through the distal radius. The ulna is normal in appearance. No wrist dislocation. The carpal bones and visualized metacarpal bones are intact. Degenerative changes are seen at the base of the first metacarpal. IMPRESSION: Nondisplaced subtle distal radius fracture.  Electronically Signed   By: Gerome Sam III M.D   On: 08/12/2015 15:36   I have personally reviewed and evaluated these images and lab results as part of my medical decision-making.   EKG Interpretation None      MDM   Final diagnoses:  Distal radius fracture, right, closed, with routine healing, subsequent encounter    Placed in a velcro thumb spica with ace wrapped around it.  That way if she takes it off again it be placed back.     Azalia Bilis, MD 08/13/15 [phone]

## 2015-08-13 NOTE — ED Notes (Signed)
Pt was seen yesterday for right wrist fx-pt removed splint last night-hx of demtia-pt is independent living at [facility]-pt brought in by son

## 2015-08-16 ENCOUNTER — Ambulatory Visit (INDEPENDENT_AMBULATORY_CARE_PROVIDER_SITE_OTHER): Payer: Medicare Other | Admitting: Family Medicine

## 2015-08-16 ENCOUNTER — Encounter: Payer: Self-pay | Admitting: Family Medicine

## 2015-08-16 VITALS — BP 129/69 | HR 69 | Ht 68.0 in | Wt 117.0 lb

## 2015-08-16 DIAGNOSIS — S52501A Unspecified fracture of the lower end of right radius, initial encounter for closed fracture: Secondary | ICD-10-CM | POA: Diagnosis present

## 2015-08-16 NOTE — Patient Instructions (Signed)
You have a nondisplaced distal radius fracture. Wear the wrist brace at all times except when icing and washing the area. Icing 15 minutes at a time 3-4 times a day. Elevate above your heart as needed for swelling. Follow up with me in 1 1/2 weeks for reevaluation. We will repeat your x-rays at that time. Expect about 6 weeks for this to heal completely.

## 2015-08-17 DIAGNOSIS — S52501A Unspecified fracture of the lower end of right radius, initial encounter for closed fracture: Secondary | ICD-10-CM | POA: Insufficient documentation

## 2015-08-17 NOTE — Progress Notes (Signed)
PCP: Doreatha Martin, MD  Subjective:   HPI: Patient is a 82 y.o. female here for right wrist injury.  Patient reports on 2/14 she accidentally fell down in her apartment - believes she landed onto her back though immediate pain, bruising in right wrist. Pain level currently 4/10, dull. Has improved since injury. Had a hard splint but took this off and now using a thumb spica brace. No prior injuries. No fever, other skin changes, other complaints.  Past Medical History  Diagnosis Date  . Coronary artery disease   . Hypertension   . GERD (gastroesophageal reflux disease)   . COPD (chronic obstructive pulmonary disease) (HCC)   . Stroke (HCC)   . Dementia     Current Outpatient Prescriptions on File Prior to Visit  Medication Sig Dispense Refill  . aspirin 81 MG chewable tablet Chew 81 mg by mouth daily.    . Cholecalciferol (VITAMIN D PO) Take 50 mg by mouth once.    . nebivolol (BYSTOLIC) 2.5 MG tablet Take 2.5 mg by mouth daily.    Marland Kitchen omeprazole (PRILOSEC) 20 MG capsule Take 20 mg by mouth daily.     No current facility-administered medications on file prior to visit.    Past Surgical History  Procedure Laterality Date  . Joint replacement    . Cataract extraction    . A-v cardiac pacemaker insertion    . Cardiac defibrillator placement      Allergies  Allergen Reactions  . Penicillins Rash    Hot feeling all over    Social History   Social History  . Marital Status: Widowed    Spouse Name: N/A  . Number of Children: N/A  . Years of Education: N/A   Occupational History  . Not on file.   Social History Main Topics  . Smoking status: Former Games developer  . Smokeless tobacco: Not on file  . Alcohol Use: No  . Drug Use: No  . Sexual Activity: Not on file   Other Topics Concern  . Not on file   Social History Narrative    No family history on file.  BP 129/69 mmHg  Pulse 69  Ht  (1.727 m)  Wt 117 lb (53.071 kg)  BMI 17.79 kg/m2  Review of  Systems: See HPI above.    Objective:  Physical Exam:  Gen: NAD  Right hand/wrist: Significant bruising throughout dorsal hand, wrist, forearm to about the elbow.  No deformity. TTP distal radius.  No other tenderness of wrist, hand, forearm. Did not test extent of wrist motion with known fracture. FROM digits. NVI distally.  Left wrist: FROM without pain.    Assessment & Plan:  1. Right distal radius fracture - Independently reviewed radiographs - nondisplaced distal radius fracture.  Continue with wrist brace, icing, elevation.  F/u in 1 1/2 weeks to repeat evaluation, radiographs.  Expect 6 weeks for total healing.

## 2015-08-17 NOTE — Assessment & Plan Note (Signed)
Independently reviewed radiographs - nondisplaced distal radius fracture.  Continue with wrist brace, icing, elevation.  F/u in 1 1/2 weeks to repeat evaluation, radiographs.  Expect 6 weeks for total healing.

## 2015-08-26 ENCOUNTER — Ambulatory Visit (HOSPITAL_BASED_OUTPATIENT_CLINIC_OR_DEPARTMENT_OTHER)
Admission: RE | Admit: 2015-08-26 | Discharge: 2015-08-26 | Disposition: A | Discharge status: Home / Self Care | Payer: Medicare Other | Source: Ambulatory Visit | Attending: Family Medicine | Admitting: Family Medicine

## 2015-08-26 ENCOUNTER — Ambulatory Visit (INDEPENDENT_AMBULATORY_CARE_PROVIDER_SITE_OTHER): Payer: Medicare Other | Admitting: Family Medicine

## 2015-08-26 ENCOUNTER — Encounter: Payer: Self-pay | Admitting: Family Medicine

## 2015-08-26 VITALS — BP 159/84 | HR 73 | Ht 69.0 in | Wt 118.0 lb

## 2015-08-26 DIAGNOSIS — M25731 Osteophyte, right wrist: Secondary | ICD-10-CM | POA: Diagnosis not present

## 2015-08-26 DIAGNOSIS — W19XXXD Unspecified fall, subsequent encounter: Secondary | ICD-10-CM | POA: Diagnosis not present

## 2015-08-26 DIAGNOSIS — S52501D Unspecified fracture of the lower end of right radius, subsequent encounter for closed fracture with routine healing: Secondary | ICD-10-CM

## 2015-08-26 DIAGNOSIS — S6991XD Unspecified injury of right wrist, hand and finger(s), subsequent encounter: Secondary | ICD-10-CM | POA: Diagnosis present

## 2015-08-26 NOTE — Patient Instructions (Signed)
Wear your wrist brace for 4 more weeks then discontinue this. Icing, tylenol if needed for pain. Follow up with me in 4 weeks if you're having a problem.

## 2015-08-31 NOTE — Progress Notes (Signed)
PCP: Doreatha Martin, MD  Subjective:   HPI: Patient is a 82 y.o. female here for right wrist injury.  2/20: Patient reports on 2/14 she accidentally fell down in her apartment - believes she landed onto her back though immediate pain, bruising in right wrist. Pain level currently 4/10, dull. Has improved since injury. Had a hard splint but took this off and now using a thumb spica brace. No prior injuries. No fever, other skin changes, other complaints.  3/2: Patient reports she has no pain in right wrist. Using brace. Pain level 0/10. Bruising improved. No rash, fever.  Past Medical History  Diagnosis Date  . Coronary artery disease   . Hypertension   . GERD (gastroesophageal reflux disease)   . COPD (chronic obstructive pulmonary disease) (HCC)   . Stroke (HCC)   . Dementia     Current Outpatient Prescriptions on File Prior to Visit  Medication Sig Dispense Refill  . amLODipine (NORVASC) 5 MG tablet Take 5 mg by mouth.    Marland Kitchen aspirin 81 MG chewable tablet Chew 81 mg by mouth daily.    . Cholecalciferol (VITAMIN D PO) Take 50 mg by mouth once.    . donepezil (ARICEPT) 5 MG tablet TAKE 1 TABLET (5 MG TOTAL) BY MOUTH NIGHTLY.  3  . isosorbide mononitrate (ISMO,MONOKET) 10 MG tablet TAKE 1 TABLET (10 MG TOTAL) BY MOUTH TWO (2) TIMES A DAY.  3  . nebivolol (BYSTOLIC) 2.5 MG tablet Take 2.5 mg by mouth daily.    . nitroGLYCERIN (NITROSTAT) 0.4 MG SL tablet Place 0.4 mg under the tongue.    Marland Kitchen omeprazole (PRILOSEC) 20 MG capsule Take 20 mg by mouth daily.    . pravastatin (PRAVACHOL) 20 MG tablet TAKE 1 TABLET (20 MG TOTAL) BY MOUTH EVERY EVENING.  11   No current facility-administered medications on file prior to visit.    Past Surgical History  Procedure Laterality Date  . Joint replacement    . Cataract extraction    . A-v cardiac pacemaker insertion    . Cardiac defibrillator placement      Allergies  Allergen Reactions  . Penicillins Rash    Hot feeling all  over    Social History   Social History  . Marital Status: Widowed    Spouse Name: N/A  . Number of Children: N/A  . Years of Education: N/A   Occupational History  . Not on file.   Social History Main Topics  . Smoking status: Former Games developer  . Smokeless tobacco: Not on file  . Alcohol Use: No  . Drug Use: No  . Sexual Activity: Not on file   Other Topics Concern  . Not on file   Social History Narrative    No family history on file.  BP 159/84 mmHg  Pulse 73  Ht  (1.753 m)  Wt 118 lb (53.524 kg)  BMI 17.42 kg/m2  Review of Systems: See HPI above.    Objective:  Physical Exam:  Gen: NAD  Right hand/wrist: Bruising only proximal forearm now.  No deformity. Minimal TTP distal radius.  No other tenderness of wrist, hand, forearm. Did not test extent of wrist motion with known fracture. FROM digits. NVI distally.  Left wrist: FROM without pain.    Assessment & Plan:  1. Right distal radius fracture - Independently reviewed radiographs - healing noted and no displacement.  Continue with wrist brace for 4 weeks then discontinue.  Icing, tylenol as needed.  F/u in 4 weeks  if having any issues.

## 2015-08-31 NOTE — Assessment & Plan Note (Signed)
Independently reviewed radiographs - healing noted and no displacement.  Continue with wrist brace for 4 weeks then discontinue.  Icing, tylenol as needed.  F/u in 4 weeks if having any issues.

## 2015-10-06 ENCOUNTER — Emergency Department (HOSPITAL_BASED_OUTPATIENT_CLINIC_OR_DEPARTMENT_OTHER): Payer: Medicare Other

## 2015-10-06 ENCOUNTER — Inpatient Hospital Stay (HOSPITAL_BASED_OUTPATIENT_CLINIC_OR_DEPARTMENT_OTHER)
Admission: EM | Admit: 2015-10-06 | Discharge: 2015-10-13 | DRG: 871 | Disposition: A | Discharge status: Skilled Nursing Facility | Payer: Medicare Other | Attending: Internal Medicine | Admitting: Internal Medicine

## 2015-10-06 ENCOUNTER — Encounter (HOSPITAL_BASED_OUTPATIENT_CLINIC_OR_DEPARTMENT_OTHER): Payer: Self-pay | Admitting: *Deleted

## 2015-10-06 DIAGNOSIS — G9341 Metabolic encephalopathy: Secondary | ICD-10-CM | POA: Diagnosis present

## 2015-10-06 DIAGNOSIS — G9340 Encephalopathy, unspecified: Secondary | ICD-10-CM | POA: Diagnosis present

## 2015-10-06 DIAGNOSIS — Z79899 Other long term (current) drug therapy: Secondary | ICD-10-CM | POA: Diagnosis not present

## 2015-10-06 DIAGNOSIS — L8990 Pressure ulcer of unspecified site, unspecified stage: Secondary | ICD-10-CM | POA: Diagnosis present

## 2015-10-06 DIAGNOSIS — Z7982 Long term (current) use of aspirin: Secondary | ICD-10-CM

## 2015-10-06 DIAGNOSIS — F0390 Unspecified dementia without behavioral disturbance: Secondary | ICD-10-CM | POA: Diagnosis present

## 2015-10-06 DIAGNOSIS — Z8673 Personal history of transient ischemic attack (TIA), and cerebral infarction without residual deficits: Secondary | ICD-10-CM

## 2015-10-06 DIAGNOSIS — I2510 Atherosclerotic heart disease of native coronary artery without angina pectoris: Secondary | ICD-10-CM | POA: Diagnosis present

## 2015-10-06 DIAGNOSIS — I110 Hypertensive heart disease with heart failure: Secondary | ICD-10-CM | POA: Diagnosis present

## 2015-10-06 DIAGNOSIS — B952 Enterococcus as the cause of diseases classified elsewhere: Secondary | ICD-10-CM | POA: Diagnosis present

## 2015-10-06 DIAGNOSIS — J9611 Chronic respiratory failure with hypoxia: Secondary | ICD-10-CM | POA: Diagnosis present

## 2015-10-06 DIAGNOSIS — Z87891 Personal history of nicotine dependence: Secondary | ICD-10-CM

## 2015-10-06 DIAGNOSIS — Z9981 Dependence on supplemental oxygen: Secondary | ICD-10-CM | POA: Diagnosis not present

## 2015-10-06 DIAGNOSIS — E119 Type 2 diabetes mellitus without complications: Secondary | ICD-10-CM | POA: Diagnosis present

## 2015-10-06 DIAGNOSIS — J439 Emphysema, unspecified: Secondary | ICD-10-CM

## 2015-10-06 DIAGNOSIS — E43 Unspecified severe protein-calorie malnutrition: Secondary | ICD-10-CM | POA: Diagnosis present

## 2015-10-06 DIAGNOSIS — R7881 Bacteremia: Secondary | ICD-10-CM | POA: Diagnosis present

## 2015-10-06 DIAGNOSIS — B9620 Unspecified Escherichia coli [E. coli] as the cause of diseases classified elsewhere: Secondary | ICD-10-CM | POA: Diagnosis present

## 2015-10-06 DIAGNOSIS — A4181 Sepsis due to Enterococcus: Secondary | ICD-10-CM | POA: Diagnosis present

## 2015-10-06 DIAGNOSIS — R319 Hematuria, unspecified: Secondary | ICD-10-CM | POA: Diagnosis present

## 2015-10-06 DIAGNOSIS — Z681 Body mass index (BMI) 19 or less, adult: Secondary | ICD-10-CM | POA: Diagnosis not present

## 2015-10-06 DIAGNOSIS — Z87440 Personal history of urinary (tract) infections: Secondary | ICD-10-CM

## 2015-10-06 DIAGNOSIS — N390 Urinary tract infection, site not specified: Secondary | ICD-10-CM | POA: Diagnosis present

## 2015-10-06 DIAGNOSIS — R7303 Prediabetes: Secondary | ICD-10-CM | POA: Diagnosis not present

## 2015-10-06 DIAGNOSIS — J449 Chronic obstructive pulmonary disease, unspecified: Secondary | ICD-10-CM | POA: Diagnosis not present

## 2015-10-06 DIAGNOSIS — F03918 Unspecified dementia, unspecified severity, with other behavioral disturbance: Secondary | ICD-10-CM | POA: Diagnosis present

## 2015-10-06 DIAGNOSIS — I5022 Chronic systolic (congestive) heart failure: Secondary | ICD-10-CM | POA: Diagnosis present

## 2015-10-06 DIAGNOSIS — A419 Sepsis, unspecified organism: Secondary | ICD-10-CM | POA: Diagnosis present

## 2015-10-06 DIAGNOSIS — F0391 Unspecified dementia with behavioral disturbance: Secondary | ICD-10-CM

## 2015-10-06 DIAGNOSIS — I10 Essential (primary) hypertension: Secondary | ICD-10-CM

## 2015-10-06 DIAGNOSIS — E872 Acidosis: Secondary | ICD-10-CM | POA: Diagnosis present

## 2015-10-06 DIAGNOSIS — Z880 Allergy status to penicillin: Secondary | ICD-10-CM

## 2015-10-06 DIAGNOSIS — K219 Gastro-esophageal reflux disease without esophagitis: Secondary | ICD-10-CM | POA: Diagnosis present

## 2015-10-06 DIAGNOSIS — Z8249 Family history of ischemic heart disease and other diseases of the circulatory system: Secondary | ICD-10-CM | POA: Diagnosis not present

## 2015-10-06 DIAGNOSIS — Z95810 Presence of automatic (implantable) cardiac defibrillator: Secondary | ICD-10-CM

## 2015-10-06 LAB — APTT: aPTT: 34 seconds (ref 24–37)

## 2015-10-06 LAB — URINE MICROSCOPIC-ADD ON: SQUAMOUS EPITHELIAL / LPF: NONE SEEN

## 2015-10-06 LAB — COMPREHENSIVE METABOLIC PANEL
ALT: 11 U/L — AB (ref 14–54)
AST: 25 U/L (ref 15–41)
Albumin: 3.2 g/dL — ABNORMAL LOW (ref 3.5–5.0)
Alkaline Phosphatase: 124 U/L (ref 38–126)
Anion gap: 8 (ref 5–15)
BUN: 24 mg/dL — AB (ref 6–20)
CHLORIDE: 103 mmol/L (ref 101–111)
CO2: 27 mmol/L (ref 22–32)
CREATININE: 1.05 mg/dL — AB (ref 0.44–1.00)
Calcium: 8.7 mg/dL — ABNORMAL LOW (ref 8.9–10.3)
GFR calc Af Amer: 54 mL/min — ABNORMAL LOW (ref >60)
GFR calc non Af Amer: 46 mL/min — ABNORMAL LOW (ref >60)
Glucose, Bld: 144 mg/dL — ABNORMAL HIGH (ref 65–99)
Potassium: 4.2 mmol/L (ref 3.5–5.1)
SODIUM: 138 mmol/L (ref 135–145)
Total Bilirubin: 0.9 mg/dL (ref 0.3–1.2)
Total Protein: 6.8 g/dL (ref 6.5–8.1)

## 2015-10-06 LAB — URINALYSIS, ROUTINE W REFLEX MICROSCOPIC
BILIRUBIN URINE: NEGATIVE
Glucose, UA: NEGATIVE mg/dL
KETONES UR: NEGATIVE mg/dL
NITRITE: POSITIVE — AB
Protein, ur: 100 mg/dL — AB
Specific Gravity, Urine: 1.014 (ref 1.005–1.030)
pH: 6 (ref 5.0–8.0)

## 2015-10-06 LAB — CBC
HCT: 46.6 % — ABNORMAL HIGH (ref 36.0–46.0)
Hemoglobin: 15.3 g/dL — ABNORMAL HIGH (ref 12.0–15.0)
MCH: 32.1 pg (ref 26.0–34.0)
MCHC: 32.8 g/dL (ref 30.0–36.0)
MCV: 97.9 fL (ref 78.0–100.0)
PLATELETS: 115 10*3/uL — AB (ref 150–400)
RBC: 4.76 MIL/uL (ref 3.87–5.11)
RDW: 15 % (ref 11.5–15.5)
WBC: 13.9 10*3/uL — AB (ref 4.0–10.5)

## 2015-10-06 LAB — I-STAT CG4 LACTIC ACID, ED: Lactic Acid, Venous: 2.5 mmol/L (ref 0.5–2.0)

## 2015-10-06 LAB — PROTIME-INR
INR: 1.19 (ref 0.00–1.49)
PROTHROMBIN TIME: 15.2 s (ref 11.6–15.2)

## 2015-10-06 MED ORDER — VANCOMYCIN HCL IN DEXTROSE 1-5 GM/200ML-% IV SOLN
1000.0000 mg | Freq: Once | INTRAVENOUS | Status: AC
Start: 2015-10-06 — End: 2015-10-06
  Administered 2015-10-06: 1000 mg via INTRAVENOUS
  Filled 2015-10-06: qty 200

## 2015-10-06 MED ORDER — SODIUM CHLORIDE 0.9 % IV SOLN
Freq: Once | INTRAVENOUS | Status: AC
  Administered 2015-10-06: 21:00:00 via INTRAVENOUS

## 2015-10-06 MED ORDER — ACETAMINOPHEN 650 MG RE SUPP
650.0000 mg | Freq: Once | RECTAL | Status: AC
  Administered 2015-10-06: 650 mg via RECTAL
  Filled 2015-10-06: qty 1

## 2015-10-06 MED ORDER — CEFEPIME HCL 2 G IJ SOLR
2.0000 g | Freq: Once | INTRAMUSCULAR | Status: AC
  Administered 2015-10-06: 2 g via INTRAVENOUS

## 2015-10-06 MED ORDER — CEFEPIME HCL 2 G IJ SOLR
INTRAMUSCULAR | Status: AC
  Filled 2015-10-06: qty 2

## 2015-10-06 MED ORDER — VANCOMYCIN HCL 500 MG IV SOLR
500.0000 mg | INTRAVENOUS | Status: DC
  Filled 2015-10-06: qty 500

## 2015-10-06 MED ORDER — DEXTROSE 5 % IV SOLN: 1.0000 g | INTRAVENOUS | Status: DC

## 2015-10-06 MED ORDER — DEXTROSE 5 % IV SOLN
2.0000 g | INTRAVENOUS | Status: DC
  Administered 2015-10-07 – 2015-10-09 (×3): 2 g via INTRAVENOUS
  Filled 2015-10-06 (×4): qty 2

## 2015-10-06 MED ORDER — SODIUM CHLORIDE 0.9 % IV BOLUS (SEPSIS)
1000.0000 mL | INTRAVENOUS | Status: AC
  Administered 2015-10-06 – 2015-10-07 (×2): 1000 mL via INTRAVENOUS

## 2015-10-06 NOTE — ED Provider Notes (Signed)
CSN: [id number]     Arrival date & time 10/06/15  1812 History  By signing my name below, I, Linna Darner, attest that this documentation has been prepared under the direction and in the presence of physician practitioner, Melene Plan, DO. Electronically Signed: Linna Darner, Scribe. 10/06/2015. 6:35 PM.     Chief Complaint  Patient presents with  . Shortness of Breath    The history is provided by the patient. No language interpreter was used.    HPI Comments: LEVEL 5 CAVEAT DUE TO DEMENTIA Donna Dalton is a 82 y.o. female brought in by EMS with h/o dementia, GERD, COPD, CAD, and HTN who presents to the Emergency Department complaining of sudden onset, constant, SOB beginning earlier today. She endorses an associated fever; her temperature was measured at 104.1 upon arrival to the ER. Pt also endorses an associated dry cough. She wears oxygen at home but is unsure of how much. She denies CP, dysuria, abdominal pain, thyroid problems, or any other associated symptoms. Pt lives at [facility].   Past Medical History  Diagnosis Date  . Coronary artery disease   . Hypertension   . GERD (gastroesophageal reflux disease)   . COPD (chronic obstructive pulmonary disease) (HCC)   . Stroke (HCC)   . Dementia    Past Surgical History  Procedure Laterality Date  . Joint replacement    . Cataract extraction    . A-v cardiac pacemaker insertion    . Cardiac defibrillator placement     History reviewed. No pertinent family history. Social History  Substance Use Topics  . Smoking status: Former Games developer  . Smokeless tobacco: None  . Alcohol Use: No   OB History    No data available     Review of Systems  Unable to perform ROS: Dementia  Constitutional: Positive for fever. Negative for chills.  HENT: Negative for congestion and rhinorrhea.   Eyes: Negative for redness and visual disturbance.  Respiratory: Positive for cough and shortness of breath. Negative for wheezing.    Cardiovascular: Negative for chest pain and palpitations.  Gastrointestinal: Negative for nausea, vomiting and abdominal distention.  Genitourinary: Negative for dysuria and urgency.  Musculoskeletal: Negative for myalgias and arthralgias.  Skin: Negative for pallor and wound.  Neurological: Negative for dizziness and headaches.    Allergies  Penicillins  Home Medications   Prior to Admission medications   Medication Sig Start Date End Date Taking? Authorizing Provider  amLODipine (NORVASC) 5 MG tablet Take 5 mg by mouth. 01/18/15  Yes Historical Provider, MD  aspirin 81 MG chewable tablet Chew 81 mg by mouth daily.   Yes Historical Provider, MD  Cholecalciferol (VITAMIN D PO) Take 50 mg by mouth once.   Yes Historical Provider, MD  donepezil (ARICEPT) 5 MG tablet TAKE 1 TABLET (5 MG TOTAL) BY MOUTH NIGHTLY. 07/07/15  Yes Historical Provider, MD  hydrOXYzine (ATARAX/VISTARIL) 10 MG tablet Take 10 mg by mouth daily as needed for itching.   Yes Historical Provider, MD  isosorbide mononitrate (ISMO,MONOKET) 10 MG tablet TAKE 1 TABLET (10 MG TOTAL) BY MOUTH TWO (2) TIMES A DAY. 07/08/15  Yes Historical Provider, MD  nebivolol (BYSTOLIC) 2.5 MG tablet Take 2.5 mg by mouth daily.   Yes Historical Provider, MD  nitroGLYCERIN (NITROSTAT) 0.4 MG SL tablet Place 0.4 mg under the tongue every 5 (five) minutes as needed for chest pain.    Yes Historical Provider, MD  omeprazole (PRILOSEC) 20 MG capsule Take 20 mg by mouth daily.  Yes Historical Provider, MD  pravastatin (PRAVACHOL) 20 MG tablet TAKE 1 TABLET (20 MG TOTAL) BY MOUTH EVERY EVENING. 07/23/15  Yes Historical Provider, MD   BP 129/59 mmHg  Pulse 68  Temp(Src) 98.3 F (36.8 C) (Oral)  Resp 16  Wt 113 lb 15.7 oz (51.7 kg)  SpO2 94% Physical Exam  Constitutional: She is oriented to person, place, and time. She appears well-developed and well-nourished. She appears cachectic. No distress.  HENT:  Head: Normocephalic and atraumatic.   Eyes: EOM are normal. Pupils are equal, round, and reactive to light.  Neck: Normal range of motion. Neck supple.  Cardiovascular: Normal rate and regular rhythm.  Exam reveals no gallop and no friction rub.   No murmur heard. Pulmonary/Chest: Effort normal. She has no wheezes. She has no rales.  Clear lung sounds with poor respiratory effort  Abdominal: Soft. She exhibits no distension. There is no tenderness.  Musculoskeletal: She exhibits no edema or tenderness.  1+ peripheral edema bilaterally up to the shins  Neurological: She is alert and oriented to person, place, and time.  Skin: Skin is warm and dry. She is not diaphoretic.  Warm to touch  Psychiatric: She has a normal mood and affect. Her behavior is normal.  Nursing note and vitals reviewed.   ED Course  Procedures (including critical care time)  DIAGNOSTIC STUDIES: Oxygen Saturation is 86% on RA, low by my interpretation.    COORDINATION OF CARE: 6:45 PM Discussed treatment plan with pt at bedside and pt agreed to plan.  Labs Review Labs Reviewed  URINALYSIS, ROUTINE W REFLEX MICROSCOPIC (NOT [facility]) - Abnormal; Notable for the following:    APPearance CLOUDY (*)    Hgb urine dipstick LARGE (*)    Protein, ur 100 (*)    Nitrite POSITIVE (*)    Leukocytes, UA LARGE (*)    All other components within normal limits  CBC - Abnormal; Notable for the following:    WBC 13.9 (*)    Hemoglobin 15.3 (*)    HCT 46.6 (*)    Platelets 115 (*)    All other components within normal limits  COMPREHENSIVE METABOLIC PANEL - Abnormal; Notable for the following:    Glucose, Bld 144 (*)    BUN 24 (*)    Creatinine, Ser 1.05 (*)    Calcium 8.7 (*)    Albumin 3.2 (*)    ALT 11 (*)    GFR calc non Af Amer 46 (*)    GFR calc Af Amer 54 (*)    All other components within normal limits  URINE MICROSCOPIC-ADD ON - Abnormal; Notable for the following:    Bacteria, UA MANY (*)    All other components within normal limits  I-STAT  CG4 LACTIC ACID, ED - Abnormal; Notable for the following:    Lactic Acid, Venous 2.50 (*)    All other components within normal limits  CULTURE, BLOOD (ROUTINE X 2)  CULTURE, BLOOD (ROUTINE X 2)  LACTIC ACID, PLASMA  LACTIC ACID, PLASMA  INFLUENZA PANEL BY PCR (TYPE A & B, H1N1)    Imaging Review Dg Chest 2 View  10/06/2015  CLINICAL DATA:  Shortness of breath for 1 day.  Fever. EXAM: CHEST  2 VIEW COMPARISON:  August 12, 2015 and Nov 22, 2013 FINDINGS: Lungs are hyperexpanded. There is scarring in the right upper lobe region, stable. There is no edema or consolidation. Heart is upper normal in size with pulmonary vascularity within normal limits. Pacemaker leads  are attached to the right atrium, right ventricle, and left ventricle. Bones are osteoporotic. No adenopathy evident. IMPRESSION: Scarring right upper lobe. Lungs hyperexpanded. No edema or consolidation. Stable cardiac silhouette. Electronically Signed   By: Bretta Bang III M.D.   On: 10/06/2015 19:07   I have personally reviewed and evaluated these images and lab results as part of my medical decision-making.   EKG Interpretation   Date/Time:  Wednesday October 06 2015 18:40:06 EDT Ventricular Rate:  85 PR Interval:  255 QRS Duration: 112 QT Interval:  358 QTC Calculation: 426 R Axis:     Text Interpretation:  [facility] No significant change since last tracing  Confirmed by Jahlil Ziller MD, DANIEL [phone]) on 10/06/2015 7:46:01 PM      MDM   Final diagnoses:  Sepsis, due to unspecified organism [facility])  Acute UTI (urinary tract infection)    82 yo F with fever and AMS.  Found to have UTI, + lactic acid.  Hypoxic, unsure if new or not. Given broad spectrum abx, admit.  I personally performed the services described in this documentation, which was scribed in my presence. The recorded information has been reviewed and is accurate.    The patients results and plan were reviewed and discussed.   Any x-rays performed were  independently reviewed by myself.   Differential diagnosis were considered with the presenting HPI.  Medications  ceFEPIme (MAXIPIME) 2 g injection (not administered)  ceFEPIme (MAXIPIME) 1 g in dextrose 5 % 50 mL IVPB (not administered)  0.9 %  sodium chloride infusion ( Intravenous New Bag/Given 10/06/15 2050)  acetaminophen (TYLENOL) suppository 650 mg (650 mg Rectal Given 10/06/15 1834)  vancomycin (VANCOCIN) IVPB 1000 mg/200 mL premix (1,000 mg Intravenous New Bag/Given 10/06/15 2018)  ceFEPIme (MAXIPIME) 2 g in dextrose 5 % 50 mL IVPB (0 g Intravenous Stopped 10/06/15 2017)    Filed Vitals:   10/06/15 1826 10/06/15 1900 10/06/15 2011 10/06/15 2153  BP: 139/66 129/60 115/51 129/59  Pulse: 89 80 74 68  Temp: 104.1 F (40.1 C)  99.2 F (37.3 C) 98.3 F (36.8 C)  TempSrc: Rectal   Oral  Resp: Weight:    113 lb 15.7 oz (51.7 kg)  SpO2: 86% 95% 94% 94%    Final diagnoses:  Sepsis, due to unspecified organism [facility])  Acute UTI (urinary tract infection)    Admission/ observation were discussed with the admitting physician, patient and/or family and they are comfortable with the plan.    Melene Plan, DO 10/06/15 2244

## 2015-10-06 NOTE — ED Notes (Signed)
MD at bedside. 

## 2015-10-06 NOTE — ED Notes (Signed)
Pt is  from [place] brought in by EMS for SOB, fever x 1 day

## 2015-10-06 NOTE — Progress Notes (Signed)
Pharmacy Antibiotic Note  Richardine Servicenn Engelstad is a 82 y.o. female admitted on 10/06/2015 with possible HCAP.  Pharmacy has been consulted for vancomycin and cefepime dosing.  Plan: Vancomycin 500 IV every 24 hours.  Goal trough 15-20 mcg/mL.  Change cefepime to 2g IV Q24H.  Weight: 113 lb 15.7 oz (51.7 kg)  Temp (24hrs), Avg:100.5 F (38.1 C), Min:98.3 F (36.8 C), Max:104.1 F (40.1 C)   Recent Labs Lab 10/06/15 1827 10/06/15 1855  WBC 13.9*  --   CREATININE 1.05*  --   LATICACIDVEN  --  2.50*    Estimated Creatinine Clearance: 30.8 mL/min (by C-G formula based on Cr of 1.05).    Allergies  Allergen Reactions  . Penicillins Rash    Hot feeling all over Has patient had a PCN reaction causing immediate rash, facial/tongue/throat swelling, SOB or lightheadedness with hypotension:YES Has patient had a PCN reaction causing severe rash involving mucus membranes or skin necrosis: NO Has patient had a PCN reaction that required hospitalization NO Has patient had a PCN reaction occurring within the last 10 years: NO If all of the above answers are "NO", then may proceed with Cephalosporin use.       Thank you for allowing pharmacy to be a part of this patient's care.  Vernard GamblesVeronda Anagha Loseke, PharmD, BCPS  10/06/2015 11:30 PM

## 2015-10-06 NOTE — Progress Notes (Signed)
Pharmacy Antibiotic Note  Donna Dalton is a 82 y.o. female admitted on 10/06/2015 with UTI.  Pharmacy has been consulted for cefepime dosing. Tmax is 104.1 and WBC is elevated at 13.9. Lactic acid is elevated at 2.5. SCr is 1.05.   Plan: - Cefepime 2gm IV x 1 then 1gm IV Q24H - F/u renal fxn, C&S, clinical status - Continue vancomycin?     Temp (24hrs), Avg:104.1 F (40.1 C), Min:104.1 F (40.1 C), Max:104.1 F (40.1 C)   Recent Labs Lab 10/06/15 1827 10/06/15 1855  WBC 13.9*  --   CREATININE 1.05*  --   LATICACIDVEN  --  2.50*    CrCl cannot be calculated (Unknown ideal weight.).    Allergies  Allergen Reactions  . Penicillins Rash    Hot feeling all over    Antimicrobials this admission: Cefepime 4/12>> Vanc x 1 4/12  Dose adjustments this admission: N/A  Microbiology results: Pending  Thank you for allowing pharmacy to be a part of this patient's care.  Donna Dalton, Donna Dalton 10/06/2015 8:07 PM

## 2015-10-06 NOTE — H&P (Signed)
PCP:     Doreatha MartinVELAZQUEZ,GRETCHEN, MD    [facility]   Chief Complaint: Confusion  HPI: Donna Dalton [place] is a 82 y.o. female   has a past medical history of Coronary artery disease; Hypertension; GERD (gastroesophageal reflux disease); COPD (chronic obstructive pulmonary disease) (HCC); Stroke [facility](HCC); and Dementia.   Presented with fever and confusion patient was found in her home undressed appeared to be confused and warm to the touch. She has been endorsing some shortness of breath, cough At baseline patient has history of COPD and wears oxygen. Patient is a poor historian and answers  only some questions history mainly obtained through the records IN ER:  temperature measured up to 104.1, white blood cell comfort 13.9 lactic acid 2.5 patient is more confused than her baseline. Patient was started on cefepime and vancomycin given sepsis Influenza PCR ordered, UA showed evidence of UTI chest x-ray showed old scarring but no no new consolidation  Regarding pertinent past history: COPD on oxygen diet controlled diabetes past history of CVA and dementia which renders patient poor historian  Hospitalist was called for admission for sepsis in the setting of UTI  Review of Systems:    Pertinent positives include: Fevers, chills, fatigue, shortness of breath at rest.   dyspnea on exertion, Confusion, non-productive cough,  Constitutional:  No weight loss, night sweats,  weight loss  HEENT:  No headaches, Difficulty swallowing,Tooth/dental problems,Sore throat,  No sneezing, itching, ear ache, nasal congestion, post nasal drip,  Cardio-vascular:  No chest pain, Orthopnea, PND, anasarca, dizziness, palpitations.no Bilateral lower extremity swelling  GI:  No heartburn, indigestion, abdominal pain, nausea, vomiting, diarrhea, change in bowel habits, loss of appetite, melena, blood in stool, hematemesis Resp:  no  No excess mucus, no productive cough, No No coughing up of  blood.No change in color of mucus.No wheezing. Skin:  no rash or lesions. No jaundice GU:  no dysuria, change in color of urine, no urgency or frequency. No straining to urinate.  No flank pain.  Musculoskeletal:  No joint pain or no joint swelling. No decreased range of motion. No back pain.  Psych:  No change in mood or affect. No depression or anxiety. No memory loss.  Neuro: no localizing neurological complaints, no tingling, no weakness, no double vision, no gait abnormality, no slurred speech, no confusion  Otherwise ROS are negative except for above, 10 systems were reviewed  Past Medical History: Past Medical History  Diagnosis Date  . Coronary artery disease   . Hypertension   . GERD (gastroesophageal reflux disease)   . COPD (chronic obstructive pulmonary disease) (HCC)   . Stroke (HCC)   . Dementia    Past Surgical History  Procedure Laterality Date  . Joint replacement    . Cataract extraction    . A-v cardiac pacemaker insertion    . Cardiac defibrillator placement       Medications: Prior to Admission medications   Medication Sig Start Date End Date Taking? Authorizing Provider  amLODipine (NORVASC) 5 MG tablet Take 5 mg by mouth. 01/18/15  Yes Historical Provider, MD  aspirin 81 MG chewable tablet Chew 81 mg by mouth daily.   Yes Historical Provider, MD  Cholecalciferol (VITAMIN D PO) Take 50 mg by mouth once.   Yes Historical Provider, MD  donepezil (ARICEPT) 5 MG tablet TAKE 1 TABLET (5 MG TOTAL) BY MOUTH NIGHTLY. 07/07/15  Yes Historical Provider, MD  hydrOXYzine (ATARAX/VISTARIL) 10 MG tablet Take 10 mg by mouth daily  as needed for itching.   Yes Historical Provider, MD  isosorbide mononitrate (ISMO,MONOKET) 10 MG tablet TAKE 1 TABLET (10 MG TOTAL) BY MOUTH TWO (2) TIMES A DAY. 07/08/15  Yes Historical Provider, MD  nebivolol (BYSTOLIC) 2.5 MG tablet Take 2.5 mg by mouth daily.   Yes Historical Provider, MD  nitroGLYCERIN (NITROSTAT) 0.4 MG SL tablet Place  0.4 mg under the tongue every 5 (five) minutes as needed for chest pain.    Yes Historical Provider, MD  omeprazole (PRILOSEC) 20 MG capsule Take 20 mg by mouth daily.   Yes Historical Provider, MD  pravastatin (PRAVACHOL) 20 MG tablet TAKE 1 TABLET (20 MG TOTAL) BY MOUTH EVERY EVENING. 07/23/15  Yes Historical Provider, MD    Allergies:   Allergies  Allergen Reactions  . Penicillins Rash    Hot feeling all over Has patient had a PCN reaction causing immediate rash, facial/tongue/throat swelling, SOB or lightheadedness with hypotension:YES Has patient had a PCN reaction causing severe rash involving mucus membranes or skin necrosis: NO Has patient had a PCN reaction that required hospitalization NO Has patient had a PCN reaction occurring within the last 10 years: NO If all of the above answers are "NO", then may proceed with Cephalosporin use.      Social History:  Ambulatory   independently     From facility [facility]   reports that she has quit smoking. She does not have any smokeless tobacco history on file. She reports that she does not drink alcohol or use illicit drugs.     Family History: family history includes Hypertension in her other.    Physical Exam: Patient Vitals for the past 24 hrs:  BP Temp Temp src Pulse Resp SpO2 Weight  10/06/15 2153 (!) 129/59 mmHg 98.3 F (36.8 C) Oral 68 16 94 % 51.7 kg (113 lb 15.7 oz)  10/06/15 2011 (!) 115/51 mmHg 99.2 F (37.3 C) - 74 24 94 % -  10/06/15 1900 129/60 mmHg - - 80 18 95 % -  10/06/15 1826 139/66 mmHg (!) 104.1 F (40.1 C) Rectal 89 20 (!) 86 % -  10/06/15 1812 - - - - - 91 % -    1. General:  in No Acute distress 2. Psychological: Alert but not Oriented 3. Head/ENT:    Dry Mucous Membranes                          Head Non traumatic, neck supple                            Poor Dentition 4. SKIN:  decreased Skin turgor,  Skin clean Dry and intact no rash 5. Heart: Regular rate and rhythm no  Murmur, Rub or gallop 6. Lungs:  no wheezes or crackles   7. Abdomen: Soft, non-tender, Non distended 8. Lower extremities: no clubbing, cyanosis, or edema 9. Neurologically Grossly intact, moving all 4 extremities equally 10. MSK: Normal range of motion  body mass index is 16.82 kg/(m^2).   Labs on Admission:   Results for orders placed or performed during the hospital encounter of 10/06/15 (from the past 24 hour(s))  CBC     Status: Abnormal   Collection Time: 10/06/15  6:27 PM  Result Value Ref Range   WBC 13.9 (H) 4.0 - 10.5 K/uL   RBC 4.76 3.87 - 5.11 MIL/uL   Hemoglobin 15.3 (H) 12.0 - 15.0  g/dL   HCT 29.5 (H) 62.1 - 30.8 %   MCV 97.9 78.0 - 100.0 fL   MCH 32.1 26.0 - 34.0 pg   MCHC 32.8 30.0 - 36.0 g/dL   RDW 65.7 84.6 - 96.2 %   Platelets 115 (L) 150 - 400 K/uL  Comprehensive metabolic panel     Status: Abnormal   Collection Time: 10/06/15  6:27 PM  Result Value Ref Range   Sodium 138 135 - 145 mmol/L   Potassium 4.2 3.5 - 5.1 mmol/L   Chloride 103 101 - 111 mmol/L   CO2 27 22 - 32 mmol/L   Glucose, Bld 144 (H) 65 - 99 mg/dL   BUN 24 (H) 6 - 20 mg/dL   Creatinine, Ser 9.52 (H) 0.44 - 1.00 mg/dL   Calcium 8.7 (L) 8.9 - 10.3 mg/dL   Total Protein 6.8 6.5 - 8.1 g/dL   Albumin 3.2 (L) 3.5 - 5.0 g/dL   AST 25 15 - 41 U/L   ALT 11 (L) 14 - 54 U/L   Alkaline Phosphatase 124 38 - 126 U/L   Total Bilirubin 0.9 0.3 - 1.2 mg/dL   GFR calc non Af Amer 46 (L) >60 mL/min   GFR calc Af Amer 54 (L) >60 mL/min   Anion gap 8 5 - 15  Urinalysis, Routine w reflex microscopic (not at [facility])     Status: Abnormal   Collection Time: 10/06/15  6:30 PM  Result Value Ref Range   Color, Urine YELLOW YELLOW   APPearance CLOUDY (A) CLEAR   Specific Gravity, Urine 1.014 1.005 - 1.030   pH 6.0 5.0 - 8.0   Glucose, UA NEGATIVE NEGATIVE mg/dL   Hgb urine dipstick LARGE (A) NEGATIVE   Bilirubin Urine NEGATIVE NEGATIVE   Ketones, ur NEGATIVE NEGATIVE mg/dL   Protein, ur 841 (A) NEGATIVE  mg/dL   Nitrite POSITIVE (A) NEGATIVE   Leukocytes, UA LARGE (A) NEGATIVE  Urine microscopic-add on     Status: Abnormal   Collection Time: 10/06/15  6:30 PM  Result Value Ref Range   Squamous Epithelial / LPF NONE SEEN NONE SEEN   WBC, UA 6-30 0 - 5 WBC/hpf   RBC / HPF 6-30 0 - 5 RBC/hpf   Bacteria, UA MANY (A) NONE SEEN   Urine-Other MUCOUS PRESENT   I-Stat CG4 Lactic Acid, ED     Status: Abnormal   Collection Time: 10/06/15  6:55 PM  Result Value Ref Range   Lactic Acid, Venous 2.50 (HH) 0.5 - 2.0 mmol/L   Comment NOTIFIED PHYSICIAN     UA  evidence of UTI  No results found for: HGBA1C  Estimated Creatinine Clearance: 30.8 mL/min (by C-G formula based on Cr of 1.05).  BNP (last 3 results) No results for input(s): PROBNP in the last 8760 hours.  Other results:  I have pearsonaly reviewed this: ECG REPORT  Rate:85  Rhythm: Paced ST&T Change: Unchanged QTC 426  Filed Weights   10/06/15 2153  Weight: 51.7 kg (113 lb 15.7 oz)     Cultures: No results found for: SDES, SPECREQUEST, CULT, REPTSTATUS   Radiological Exams on Admission: Dg Chest 2 View  10/06/2015  CLINICAL DATA:  Shortness of breath for 1 day.  Fever. EXAM: CHEST  2 VIEW COMPARISON:  August 12, 2015 and Nov 22, 2013 FINDINGS: Lungs are hyperexpanded. There is scarring in the right upper lobe region, stable. There is no edema or consolidation. Heart is upper normal in size with pulmonary vascularity within  normal limits. Pacemaker leads are attached to the right atrium, right ventricle, and left ventricle. Bones are osteoporotic. No adenopathy evident. IMPRESSION: Scarring right upper lobe. Lungs hyperexpanded. No edema or consolidation. Stable cardiac silhouette. Electronically Signed   By: Bretta Bang III M.D.   On: 10/06/2015 19:07    Chart has been reviewed  Family not  at  Bedside    Assessment/Plan  82 year old female with history of hypertension, COPD diet-controlled diabetes was found  to have evidence of sepsis in the setting of urinary tract infection    Present on Admission:  . Sepsis (HCC)Most likely secondary to urinary tract infection although. I admit for IV broad-spectrum antibiotics and fluid resuscitation for serial lactic acid await results of urine and blood cultures obtain influenza PCR  . Automatic implantable cardioverter-defibrillator in situ Currently appears to be paced  . Borderline diabetes mellitus - Will order sliding scale hemoglobin A1c  . Chronic obstructive pulmonary disease (HCC) . Essential (primary) hypertensionAnd permissive hypertension while suspecting sepsis continue bisoprolol to avoid rebound tachycardia  . Dementia with behavioral disturbance  - chronic expect some degree of sundowning . Acute encephalopathy in the setting of dementia secondary to sepsis and likely urinary tract infection rehydrate and monitor her and appears to be somewhat improved   Prophylaxis:  Lovenox   CODE STATUS:  FULL CODE presumed to be, She is confused at baseline we'll need to discuss with family  Disposition:                            Back to current facility when stable                  Other plan as per orders.  I have spent a total of 54 min on this admission    Donna Dalton 10/07/2015, 12:33 AM    [facility]  Pager [phone]   after 2 AM please page floor coverage PA If 7AM-7PM, please contact the day team taking care of the patient  [email or web address]  Password [email or web address]

## 2015-10-06 NOTE — Plan of Care (Signed)
82 yo F   [facility] Dr Ad[facility]oyd [facility] C[facility], found naked in apartment confused febrile, hypoxic to 86% up on 2L. Found to have UTI lactic acid 2.5 Per ER MD clinically appears non-toxc Admitted for sepsis. Accepted to tele  Donna Dalton 8:01 PM

## 2015-10-07 DIAGNOSIS — J449 Chronic obstructive pulmonary disease, unspecified: Secondary | ICD-10-CM

## 2015-10-07 LAB — COMPREHENSIVE METABOLIC PANEL
ALBUMIN: 2.1 g/dL — AB (ref 3.5–5.0)
ALK PHOS: 97 U/L (ref 38–126)
ALT: 15 U/L (ref 14–54)
AST: 27 U/L (ref 15–41)
Anion gap: 8 (ref 5–15)
BILIRUBIN TOTAL: 0.7 mg/dL (ref 0.3–1.2)
BUN: 18 mg/dL (ref 6–20)
CO2: 27 mmol/L (ref 22–32)
Calcium: 8 mg/dL — ABNORMAL LOW (ref 8.9–10.3)
Chloride: 107 mmol/L (ref 101–111)
Creatinine, Ser: 0.77 mg/dL (ref 0.44–1.00)
GFR calc Af Amer: >60 mL/min (ref >60)
GFR calc non Af Amer: >60 mL/min (ref >60)
GLUCOSE: 104 mg/dL — AB (ref 65–99)
POTASSIUM: 3.9 mmol/L (ref 3.5–5.1)
Sodium: 142 mmol/L (ref 135–145)
TOTAL PROTEIN: 4.9 g/dL — AB (ref 6.5–8.1)

## 2015-10-07 LAB — CBC
HEMATOCRIT: 41.6 % (ref 36.0–46.0)
Hemoglobin: 13.4 g/dL (ref 12.0–15.0)
MCH: 32 pg (ref 26.0–34.0)
MCHC: 32.2 g/dL (ref 30.0–36.0)
MCV: 99.3 fL (ref 78.0–100.0)
Platelets: 104 10*3/uL — ABNORMAL LOW (ref 150–400)
RBC: 4.19 MIL/uL (ref 3.87–5.11)
RDW: 15.3 % (ref 11.5–15.5)
WBC: 10.1 10*3/uL (ref 4.0–10.5)

## 2015-10-07 LAB — PROCALCITONIN: PROCALCITONIN: 0.84 ng/mL

## 2015-10-07 LAB — PHOSPHORUS: PHOSPHORUS: 3.3 mg/dL (ref 2.5–4.6)

## 2015-10-07 LAB — TROPONIN I
TROPONIN I: 0.03 ng/mL (ref <0.031)
Troponin I: 0.04 ng/mL — ABNORMAL HIGH (ref <0.031)
Troponin I: 0.03 ng/mL (ref <0.031)

## 2015-10-07 LAB — MAGNESIUM: Magnesium: 2 mg/dL (ref 1.7–2.4)

## 2015-10-07 LAB — INFLUENZA PANEL BY PCR (TYPE A & B)
H1N1FLUPCR: NOT DETECTED
INFLBPCR: NEGATIVE
Influenza A By PCR: NEGATIVE

## 2015-10-07 LAB — GLUCOSE, CAPILLARY
GLUCOSE-CAPILLARY: 157 mg/dL — AB (ref 65–99)
GLUCOSE-CAPILLARY: 101 mg/dL — AB (ref 65–99)
Glucose-Capillary: 122 mg/dL — ABNORMAL HIGH (ref 65–99)
Glucose-Capillary: 78 mg/dL (ref 65–99)
Glucose-Capillary: 94 mg/dL (ref 65–99)

## 2015-10-07 LAB — LACTIC ACID, PLASMA
LACTIC ACID, VENOUS: 0.8 mmol/L (ref 0.5–2.0)
LACTIC ACID, VENOUS: 1 mmol/L (ref 0.5–2.0)

## 2015-10-07 LAB — TSH: TSH: 0.444 u[IU]/mL (ref 0.350–4.500)

## 2015-10-07 LAB — MRSA PCR SCREENING: MRSA by PCR: NEGATIVE

## 2015-10-07 MED ORDER — HYDROXYZINE HCL 10 MG PO TABS
10.0000 mg | ORAL_TABLET | Freq: Every day | ORAL | Status: DC | PRN
  Administered 2015-10-09 – 2015-10-10 (×2): 10 mg via ORAL
  Filled 2015-10-07 (×2): qty 1

## 2015-10-07 MED ORDER — ONDANSETRON HCL 4 MG/2ML IJ SOLN: 4.0000 mg | Freq: Four times a day (QID) | INTRAMUSCULAR | Status: DC | PRN

## 2015-10-07 MED ORDER — ONDANSETRON HCL 4 MG PO TABS: 4.0000 mg | ORAL_TABLET | Freq: Four times a day (QID) | ORAL | Status: DC | PRN

## 2015-10-07 MED ORDER — PANTOPRAZOLE SODIUM 40 MG PO TBEC
40.0000 mg | DELAYED_RELEASE_TABLET | Freq: Every day | ORAL | Status: DC
  Administered 2015-10-07 – 2015-10-13 (×7): 40 mg via ORAL
  Filled 2015-10-07 (×7): qty 1

## 2015-10-07 MED ORDER — ACETAMINOPHEN 325 MG PO TABS
650.0000 mg | ORAL_TABLET | Freq: Four times a day (QID) | ORAL | Status: DC | PRN
  Administered 2015-10-10: 650 mg via ORAL
  Filled 2015-10-07: qty 2

## 2015-10-07 MED ORDER — INSULIN ASPART 100 UNIT/ML ~~LOC~~ SOLN: 0.0000 [IU] | Freq: Every day | SUBCUTANEOUS | Status: DC

## 2015-10-07 MED ORDER — DONEPEZIL HCL 5 MG PO TABS
5.0000 mg | ORAL_TABLET | Freq: Every day | ORAL | Status: DC
Start: 2015-10-07 — End: 2015-10-13
  Administered 2015-10-07 – 2015-10-12 (×7): 5 mg via ORAL
  Filled 2015-10-07 (×7): qty 1

## 2015-10-07 MED ORDER — NEBIVOLOL HCL 2.5 MG PO TABS
2.5000 mg | ORAL_TABLET | Freq: Every day | ORAL | Status: DC
  Administered 2015-10-07 – 2015-10-13 (×7): 2.5 mg via ORAL
  Filled 2015-10-07 (×7): qty 1

## 2015-10-07 MED ORDER — ISOSORBIDE MONONITRATE 10 MG PO TABS
10.0000 mg | ORAL_TABLET | Freq: Two times a day (BID) | ORAL | Status: DC
  Administered 2015-10-07 – 2015-10-13 (×13): 10 mg via ORAL
  Filled 2015-10-07 (×14): qty 1

## 2015-10-07 MED ORDER — ACETAMINOPHEN 650 MG RE SUPP: 650.0000 mg | Freq: Four times a day (QID) | RECTAL | Status: DC | PRN

## 2015-10-07 MED ORDER — ENOXAPARIN SODIUM 30 MG/0.3ML ~~LOC~~ SOLN
30.0000 mg | SUBCUTANEOUS | Status: DC
  Administered 2015-10-07 – 2015-10-10 (×4): 30 mg via SUBCUTANEOUS
  Filled 2015-10-07 (×5): qty 0.3

## 2015-10-07 MED ORDER — PRAVASTATIN SODIUM 20 MG PO TABS
20.0000 mg | ORAL_TABLET | Freq: Every day | ORAL | Status: DC
  Administered 2015-10-07 – 2015-10-13 (×7): 20 mg via ORAL
  Filled 2015-10-07 (×7): qty 1

## 2015-10-07 MED ORDER — INSULIN ASPART 100 UNIT/ML ~~LOC~~ SOLN
0.0000 [IU] | Freq: Three times a day (TID) | SUBCUTANEOUS | Status: DC
  Administered 2015-10-07: 2 [IU] via SUBCUTANEOUS
  Administered 2015-10-09 – 2015-10-12 (×2): 1 [IU] via SUBCUTANEOUS
  Administered 2015-10-12: 2 [IU] via SUBCUTANEOUS

## 2015-10-07 MED ORDER — ASPIRIN 81 MG PO CHEW
81.0000 mg | CHEWABLE_TABLET | Freq: Every day | ORAL | Status: DC
  Administered 2015-10-07 – 2015-10-13 (×7): 81 mg via ORAL
  Filled 2015-10-07 (×7): qty 1

## 2015-10-07 MED ORDER — GUAIFENESIN ER 600 MG PO TB12
600.0000 mg | ORAL_TABLET | Freq: Two times a day (BID) | ORAL | Status: DC
  Administered 2015-10-07 – 2015-10-13 (×14): 600 mg via ORAL
  Filled 2015-10-07 (×14): qty 1

## 2015-10-07 MED ORDER — HYDROCODONE-ACETAMINOPHEN 5-325 MG PO TABS: 1.0000 | ORAL_TABLET | ORAL | Status: DC | PRN

## 2015-10-07 MED ORDER — ALBUTEROL SULFATE (2.5 MG/3ML) 0.083% IN NEBU: 2.5000 mg | INHALATION_SOLUTION | RESPIRATORY_TRACT | Status: DC | PRN

## 2015-10-07 MED ORDER — SODIUM CHLORIDE 0.9% FLUSH
3.0000 mL | Freq: Two times a day (BID) | INTRAVENOUS | Status: DC
  Administered 2015-10-07 – 2015-10-12 (×10): 3 mL via INTRAVENOUS

## 2015-10-07 NOTE — Evaluation (Signed)
Occupational Therapy Evaluation Patient Details Name: Donna Dalton MRN: [id number] DOB: 06-Sep-1925 Today's Date: 10/07/2015    History of Present Illness 82 y.o. female has a past medical history of Coronary artery disease; Hypertension; GERD (gastroesophageal reflux disease); COPD (chronic obstructive pulmonary disease) (HCC); Stroke [facility]); and Dementia; admitted with fever and confusion.   Clinical Impression   Patient presenting with decreased ADL and functional mobility independence secondary to above. Unsure of patient's PLOF, but pt living in ALF PTA. Patient currently functioning at an overall min assist level. Patient will benefit from acute OT to increase overall independence in the areas of ADLs, functional mobility, and overall safety in order to safely discharge back to ALF (if 24/7 available VS SNF).   Patient on 2L/min supplemental 02 upon OT entering room, sats =94%. Therapist doffed for patient to ambulate to BR and in BR sats dropped to 86% (after grooming activites at sink). Pt then ambulated back to chair and sats increased to 91% on room air. Pt poor historian, but stated she wore supplemental 02 PTA. Pt able to don [place] with no assist.     Follow Up Recommendations  SNF;Supervision/Assistance - 24 hour (Unless ALF able to provide 24/7 supervision to min assist)    Equipment Recommendations  Other (comment) (TBD)    Recommendations for Other Services  None at this time    Precautions / Restrictions Precautions Precautions: Fall Precaution Comments: monitor O2 Restrictions Weight Bearing Restrictions: No    Mobility Bed Mobility General bed mobility comments: in recliner.  Transfers Overall transfer level: Needs assistance Equipment used: Rolling walker (2 wheeled) Transfers: Sit to/from Stand Sit to Stand: Min guard General transfer comment: min guard for safety. VC for hand placement and use of RW for support as needed once upright.    Balance Overall  balance assessment: Needs assistance Sitting-balance support: No upper extremity supported;Feet supported Sitting balance-Leahy Scale: Good     Standing balance support: No upper extremity supported;During functional activity Standing balance-Leahy Scale: Fair Standing balance comment: Pt stood at sink for grooming tasks of washing hands, washing face, and brushing teeth.     ADL Overall ADL's : Needs assistance/impaired Eating/Feeding: Set up;Sitting   Grooming: Min guard;Standing   Upper Body Bathing: Minimal assitance;Sitting   Lower Body Bathing: Minimal assistance;Sit to/from stand   Upper Body Dressing : Minimal assistance;Sitting   Lower Body Dressing: Minimal assistance;Sit to/from stand   Toilet Transfer: Minimal assistance;RW;BSC;Ambulation   Toileting- Clothing Manipulation and Hygiene: Supervision/safety;Sitting/lateral Biomedical scientist Details (indicate cue type and reason): did not occur, safety concerns  Functional mobility during ADLs: Minimal assistance;Cueing for safety;Cueing for sequencing;Rolling walker      Vision Additional Comments: Pt unable to state if any prior visual problems. When searching for toothbrush and toothpaste on counter, pt unable to spot on left visual field.           Pertinent Vitals/Pain Pain Assessment: No/denies pain     Hand Dominance Right   Extremity/Trunk Assessment Upper Extremity Assessment Upper Extremity Assessment: Generalized weakness   Lower Extremity Assessment Lower Extremity Assessment: Defer to PT evaluation   Cervical / Trunk Assessment Cervical / Trunk Assessment: Kyphotic   Communication Communication Communication: No difficulties   Cognition Arousal/Alertness: Awake/alert Behavior During Therapy: WFL for tasks assessed/performed Overall Cognitive Status: No family/caregiver present to determine baseline cognitive functioning (hx of dementia) Area of Impairment:  Orientation Orientation Level: Disoriented to;Place;Time;Situation   Memory: Decreased short-term memory  Home Living Family/patient expects to be discharged to:: Unsure Living Arrangements: Alone Available Help at Discharge:  (unsure) Type of Home: Apartment Home Access: Elevator     Home Layout: Multi-level Alternate Level Stairs-Number of Steps: elevator to reach 3rd floor Home Equipment: Other (comment)   Additional Comments: Poor historian, no family immediately available. According to chart, pt from ALF.     Prior Functioning/Environment Level of Independence: Independent  Comments: Pt unsure of prior level of function but states she lives alone    OT Diagnosis: Generalized weakness;Altered mental status   OT Problem List: Decreased strength;Decreased activity tolerance;Impaired balance (sitting and/or standing);Impaired vision/perception;Decreased coordination;Decreased safety awareness;Decreased cognition;Decreased knowledge of use of DME or AE;Decreased knowledge of precautions   OT Treatment/Interventions: Self-care/ADL training;Therapeutic exercise;Energy conservation;Therapeutic activities;Patient/family education;Balance training    OT Goals(Current goals can be found in the care plan section) Acute Rehab OT Goals Patient Stated Goal: none stated OT Goal Formulation: Patient unable to participate in goal setting Time For Goal Achievement: 10/14/15 Potential to Achieve Goals: Good ADL Goals Pt Will Perform Grooming: with supervision;standing Pt Will Transfer to Toilet: with supervision;ambulating;bedside commode Additional ADL Goal #1: Pt will be supervision for functional ambulation/mobility during ADL using LRAD Additional ADL Goal #2: Pt will be educated on a functional strengthening HEP   OT Frequency: Min 2X/week   Barriers to D/C: Decreased caregiver support   End of Session Equipment Utilized During Treatment: Gait belt;Rolling  walker;Oxygen Nurse Communication: Other (comment) (Pt with BM )  Activity Tolerance: Patient tolerated treatment well Patient left: in chair;with call bell/phone within reach;with chair alarm set   Time: 1243-1319 OT Time Calculation (min): 36 min Charges:  OT General Charges $OT Visit: 1 Procedure OT Evaluation $OT Eval Moderate Complexity: 1 Procedure OT Treatments $Self Care/Home Management : 8-22 mins  Edwin CapPatricia Geraldy Akridge , MS, OTR/L, CLT Pager: [phone]  10/07/2015, 1:32 PM

## 2015-10-07 NOTE — Evaluation (Signed)
Physical Therapy Evaluation Patient Details Name: Donna Dalton MRN: [id number] DOB: 09-28-1925 Today's Date: 10/07/2015   History of Present Illness  82 y.o. female has a past medical history of Coronary artery disease; Hypertension; GERD (gastroesophageal reflux disease); COPD (chronic obstructive pulmonary disease) (HCC); Stroke [facility]); and Dementia; admitted with fever and confusion.  Clinical Impression  Pt admitted with above diagnosis. Pt currently with functional limitations due to the deficits listed below (see PT Problem List). Demonstrates poor memory and awareness. Requires up to min assist for safe mobility. SpO2 94% on 2L supplemental O2 with gait. Pt will benefit from skilled PT to increase their independence and safety with mobility to allow discharge to the venue listed below.       Follow Up Recommendations SNF;Supervision/Assistance - 24 hour    Equipment Recommendations  None recommended by PT    Recommendations for Other Services       Precautions / Restrictions Precautions Precautions: Fall Precaution Comments: monitor O2 Restrictions Weight Bearing Restrictions: No      Mobility  Bed Mobility               General bed mobility comments: in recliner.  Transfers Overall transfer level: Needs assistance Equipment used: Rolling walker (2 wheeled) Transfers: Sit to/from Stand Sit to Stand: Min guard         General transfer comment: min guard for safety. VC for hand placement and use of RW for support as needed once upright.  Ambulation/Gait Ambulation/Gait assistance: Min assist Ambulation Distance (Feet): 100 Feet Assistive device: Rolling walker (2 wheeled) Gait Pattern/deviations: Step-through pattern;Decreased stride length;Drifts right/left;Trunk flexed Gait velocity: decreased Gait velocity interpretation: Below normal speed for age/gender General Gait Details: Min assist initially for balance with intermittent cues for walker placement  for proximity. Stability improved with distance. SpO2 94% on 2L supplemental O2, minimal dyspnea. Cues for directions and safety awareness.  Stairs            Wheelchair Mobility    Modified Rankin (Stroke Patients Only)       Balance Overall balance assessment: Needs assistance Sitting-balance support: No upper extremity supported;Feet supported Sitting balance-Leahy Scale: Good     Standing balance support: No upper extremity supported Standing balance-Leahy Scale: Fair                               Pertinent Vitals/Pain Pain Assessment: No/denies pain    Home Living Family/patient expects to be discharged to:: Unsure Living Arrangements: Alone Available Help at Discharge:  (Unsure) Type of Home: Apartment Home Access: Elevator     Home Layout: Multi-level Home Equipment: Other (comment) (Patient unsure) Additional Comments: Poor historian, no family immediately available    Prior Function Level of Independence: Independent         Comments: Pt unsure of prior level of function but states she lives alone     Hand Dominance   Dominant Hand: Right    Extremity/Trunk Assessment   Upper Extremity Assessment: Defer to OT evaluation           Lower Extremity Assessment: Generalized weakness;Difficult to assess due to impaired cognition         Communication   Communication: No difficulties  Cognition Arousal/Alertness: Awake/alert Behavior During Therapy: WFL for tasks assessed/performed Overall Cognitive Status: No family/caregiver present to determine baseline cognitive functioning (Hx of dementia) Area of Impairment: Orientation Orientation Level: Disoriented to;Place;Time;Situation  General Comments General comments (skin integrity, edema, etc.): SpO2 ambulating on 2L supplemental O2 94% - On room air at rest, SpO2 90%, although poor waveform at times.     Exercises        Assessment/Plan    PT  Assessment Patient needs continued PT services  PT Diagnosis Difficulty walking;Abnormality of gait;Generalized weakness;Altered mental status   PT Problem List Decreased strength;Decreased activity tolerance;Decreased balance;Decreased mobility;Decreased cognition;Decreased knowledge of use of DME;Decreased safety awareness;Cardiopulmonary status limiting activity  PT Treatment Interventions DME instruction;Gait training;Functional mobility training;Therapeutic activities;Therapeutic exercise;Balance training;Patient/family education   PT Goals (Current goals can be found in the Care Plan section) Acute Rehab PT Goals Patient Stated Goal: none stated PT Goal Formulation: With patient Time For Goal Achievement: 10/21/15 Potential to Achieve Goals: Good    Frequency Min 2X/week   Barriers to discharge Decreased caregiver support lives alone    Co-evaluation               End of Session Equipment Utilized During Treatment: Gait belt;Oxygen Activity Tolerance: Patient tolerated treatment well Patient left: in chair;with call bell/phone within reach;with chair alarm set Nurse Communication: Mobility status         Time: 1020-1039 PT Time Calculation (min) (ACUTE ONLY): 19 min   Charges:   PT Evaluation $PT Eval Moderate Complexity: 1 Procedure     PT G CodesBerton Mount:        Georgi Tuel S 10/07/2015, 10:55 AM Charlsie MerlesLogan Secor Savahanna Almendariz, PT [phone]

## 2015-10-07 NOTE — Progress Notes (Signed)
10/07/2015 9: 45 AM  (Late Entry)  Patient given AM medication (Check eMAR). This RN noticed that while collecting patient breakfast tray she had spit all her medications on the plate. Educated patient on the importance of taking prescribed medication. No evidence of learning. Instructed patient to pick up medications and take them . Witnessed patient take her medication again for a second time and swallow them with no complications. Will continue to assess and monitor the patient.   PACCAR Incyanne Hill BSN, RN-BC, [employer] [facility] Phone [phone]

## 2015-10-07 NOTE — Progress Notes (Signed)
CRITICAL VALUE ALERT  Critical value received:  Pos Blood Culture  Date of notification:  10/07/2015  Time of notification:  9:01 AM  Critical value read back:Yes.    Nurse who received alert:  Doristine Devoidyanne Hill RN  MD notified (1st page):  Dhungel, MD  Time of first page:  MD on floor. Verbalized new findings  Responding MD:  Gonzella Lexhungel, MD  Time MD responded:  MD on floor. New orders written  Comments:  Specimen Description BLOOD RIGHT ANTECUBITAL   Special Requests BOTTLES DRAWN AEROBIC AND ANAEROBIC 5CC   Culture Setup Time GRAM NEGATIVE RODS  ANAEROBIC BOTTLE ONLY  CRITICAL RESULT CALLED TO, READ BACK BY AND VERIFIED WITH: R HILL,RN AT 0901 10/07/15 BY L BENFIELD  Performed at [facility]       Culture GRAM NEGATIVE RODS   Report Status PENDING   Resulting Agency [facility]

## 2015-10-07 NOTE — Progress Notes (Signed)
[facility] PROGRESS NOTE  Donna Dalton UJW:[id number] DOB: 1925-07-27 DOA: 10/06/2015 PCP: Doreatha MartinVELAZQUEZ,GRETCHEN, MD  Brief narrative 82 year old female with history of coronary artery disease, hypertension, GERD, COPD on home O2, history of stroke and dementia brought to the ED as she was found to be acutely confused, naked in her apartment, febrile and hypoxic to 86% on 2 L via nasal cannula. She was brought to [facility] which was found to be septic with UTI, with fever and elevated lactic acid. Admit to hospitalist service.   Assessment/Plan: Sepsis with acute encephalopathy secondary to UTI Was started on empiric vancomycin and cefepime. Cultures growing gram-negative rods. Narrowed to IV cefepime only. Follow urine culture blood cultures. Continue IV hydration. Sepsis currently resolved. (Normal WBC, vitals stable and lactic acid resolved).  Diabetes mellitus Diet controlled. Monitor on sliding coverage.  COPD Continue O2 via nasal cannula. Stable. Continue when necessary albuterol nebs.   Coronary artery disease Continue aspirin, beta blocker, statin.  Dementia Continue Aricept.  ? Protein calorie malnutrition Nutrition evaluation.  DVT prophylaxis: Subcutaneous Lovenox  Diet: Heart healthy   Code Status: full Code Family Communication: None at bedside Disposition Plan: Pending clinical improvement. PT recommends skilled nursing facility.   Consultants:  None  Procedures:  None  Antibiotics:  IV vancomycin 1  IV Zosyn 4/12-  HPI/Subjective: Seen and examined. Remains afebrile this morning. Not in distress. Has confusion.  Objective: Filed Vitals:   10/06/15 2153 10/07/15 0100  BP: 129/59 138/67  Pulse: 68 69  Temp: 98.3 F (36.8 C) 98.2 F (36.8 C)  Resp: 16 16    Intake/Output Summary (Last 24 hours) at 10/07/15 1244 Last data filed at 10/07/15 0955  Gross per 24 hour  Intake   2503 ml  Output      0 ml  Net   2503 ml    Filed Weights   10/06/15 2153  Weight: 51.7 kg (113 lb 15.7 oz)    Exam:   General: Elderly female appears fatigued, not in distress  HEENT: No pallor, moist mucosa, supple neck  Chest: Clear bilaterally  CVS: Normal S1 and S2, no murmurs rub or gallop  GI: Soft, nondistended, nontender, bowel sounds present  Musculoskeletal: Warm, no edema  CNS: Alert noted to 1-2, nonfocal  Data Reviewed: Basic Metabolic Panel:  Recent Labs Lab 10/06/15 1827 10/07/15 0655  NA 138 142  K 4.2 3.9  CL 103 107  CO2 27 27  GLUCOSE 144* 104*  BUN 24* 18  CREATININE 1.05* 0.77  CALCIUM 8.7* 8.0*  MG  --  2.0  PHOS  --  3.3   Liver Function Tests:  Recent Labs Lab 10/06/15 1827 10/07/15 0655  AST 25 27  ALT 11* 15  ALKPHOS 124 97  BILITOT 0.9 0.7  PROT 6.8 4.9*  ALBUMIN 3.2* 2.1*   No results for input(s): LIPASE, AMYLASE in the last 168 hours. No results for input(s): AMMONIA in the last 168 hours. CBC:  Recent Labs Lab 10/06/15 1827 10/07/15 0655  WBC 13.9* 10.1  HGB 15.3* 13.4  HCT 46.6* 41.6  MCV 97.9 99.3  PLT 115* 104*   Cardiac Enzymes:  Recent Labs Lab 10/07/15 0224 10/07/15 0655  TROPONINI 0.04* 0.03   BNP (last 3 results) No results for input(s): BNP in the last 8760 hours.  ProBNP (last 3 results) No results for input(s): PROBNP in the last 8760 hours.  CBG:  Recent Labs Lab 10/07/15 0101 10/07/15 0753 10/07/15 1126  GLUCAP 122* 78  157*    Recent Results (from the past 240 hour(s))  Blood culture (routine x 2)     Status: None (Preliminary result)   Collection Time: 10/06/15  6:30 PM  Result Value Ref Range Status   Specimen Description BLOOD RIGHT ANTECUBITAL  Final   Special Requests   Final    BOTTLES DRAWN AEROBIC AND ANAEROBIC 5CC Performed at [facility]    Culture  Setup Time   Final    GRAM NEGATIVE RODS IN BOTH AEROBIC AND ANAEROBIC BOTTLES CRITICAL RESULT CALLED TO, READ BACK BY AND VERIFIED WITH: R  HILL,RN AT 0901 10/07/15 BY L BENFIELD    Culture GRAM NEGATIVE RODS  Final   Report Status PENDING  Incomplete  Blood culture (routine x 2)     Status: None (Preliminary result)   Collection Time: 10/06/15  7:25 PM  Result Value Ref Range Status   Specimen Description BLOOD LEFT ANTECUBITAL  Final   Special Requests   Final    BOTTLES DRAWN AEROBIC AND ANAEROBIC 3CC EACH BOTTLE   Culture PENDING  Incomplete   Report Status PENDING  Incomplete  MRSA PCR Screening     Status: None   Collection Time: 10/07/15  1:40 AM  Result Value Ref Range Status   MRSA by PCR NEGATIVE NEGATIVE Final    Comment:        The GeneXpert MRSA Assay (FDA approved for NASAL specimens only), is one component of a comprehensive MRSA colonization surveillance program. It is not intended to diagnose MRSA infection nor to guide or monitor treatment for MRSA infections.      Studies: Dg Chest 2 View  10/06/2015  CLINICAL DATA:  Shortness of breath for 1 day.  Fever. EXAM: CHEST  2 VIEW COMPARISON:  August 12, 2015 and Nov 22, 2013 FINDINGS: Lungs are hyperexpanded. There is scarring in the right upper lobe region, stable. There is no edema or consolidation. Heart is upper normal in size with pulmonary vascularity within normal limits. Pacemaker leads are attached to the right atrium, right ventricle, and left ventricle. Bones are osteoporotic. No adenopathy evident. IMPRESSION: Scarring right upper lobe. Lungs hyperexpanded. No edema or consolidation. Stable cardiac silhouette. Electronically Signed   By: Bretta Bang III M.D.   On: 10/06/2015 19:07    Scheduled Meds: . aspirin  81 mg Oral Daily  . ceFEPime (MAXIPIME) IV  2 g Intravenous Q24H  . donepezil  5 mg Oral QHS  . enoxaparin (LOVENOX) injection  30 mg Subcutaneous Q24H  . guaiFENesin  600 mg Oral BID  . insulin aspart  0-5 Units Subcutaneous QHS  . insulin aspart  0-9 Units Subcutaneous TID WC  . isosorbide mononitrate  10 mg Oral BID  .  nebivolol  2.5 mg Oral Daily  . pantoprazole  40 mg Oral Daily  . pravastatin  20 mg Oral Daily  . sodium chloride flush  3 mL Intravenous Q12H   Continuous Infusions:     Time spent: 25 minutes    Alroy Portela  [facility] Pager [phone]. If 7PM-7AM, please contact night-coverage at [email or web address], password [facility] 10/07/2015, 12:44 PM  LOS: 1 day

## 2015-10-08 DIAGNOSIS — R7881 Bacteremia: Secondary | ICD-10-CM

## 2015-10-08 LAB — CBC
HEMATOCRIT: 42.1 % (ref 36.0–46.0)
HEMOGLOBIN: 13.5 g/dL (ref 12.0–15.0)
MCH: 31.5 pg (ref 26.0–34.0)
MCHC: 32.1 g/dL (ref 30.0–36.0)
MCV: 98.4 fL (ref 78.0–100.0)
Platelets: 85 10*3/uL — ABNORMAL LOW (ref 150–400)
RBC: 4.28 MIL/uL (ref 3.87–5.11)
RDW: 15.2 % (ref 11.5–15.5)
WBC: 7.1 10*3/uL (ref 4.0–10.5)

## 2015-10-08 LAB — HEMOGLOBIN A1C
Hgb A1c MFr Bld: 5.9 % — ABNORMAL HIGH (ref 4.8–5.6)
MEAN PLASMA GLUCOSE: 123 mg/dL

## 2015-10-08 LAB — GLUCOSE, CAPILLARY
GLUCOSE-CAPILLARY: 83 mg/dL (ref 65–99)
GLUCOSE-CAPILLARY: 97 mg/dL (ref 65–99)
GLUCOSE-CAPILLARY: 85 mg/dL (ref 65–99)
Glucose-Capillary: 91 mg/dL (ref 65–99)

## 2015-10-08 MED ORDER — ENSURE ENLIVE PO LIQD
237.0000 mL | Freq: Every day | ORAL | Status: DC
  Administered 2015-10-08 – 2015-10-12 (×5): 237 mL via ORAL

## 2015-10-08 NOTE — Clinical Social Work Placement (Addendum)
   CLINICAL SOCIAL WORK PLACEMENT  NOTE  Date:  10/08/2015  Patient Details  Name: Donna Dalton MRN: [id number] Date of Birth: [birth year]  Clinical Social Work is seeking post-discharge placement for this patient at the Skilled  Nursing Facility level of care (*CSW will initial, date and re-position this form in  chart as items are completed):  Yes   Patient/family provided with [facility] Clinical Social Work Department's list of facilities offering this level of care within the geographic area requested by the patient (or if unable, by the patient's family).  Yes   Patient/family informed of their freedom to choose among providers that offer the needed level of care, that participate in Medicare, Medicaid or managed care program needed by the patient, have an available bed and are willing to accept the patient.  Yes   Patient/family informed of [facility]'s ownership interest in [facility] and [facility], as well as of the fact that they are under no obligation to receive care at these facilities.  PASRR submitted to EDS on       PASRR number received on       Existing PASRR number confirmed on 10/08/15     FL2 transmitted to all facilities in geographic area requested by pt/family on   10/08/2015    FL2 transmitted to all facilities within larger geographic area on       Patient informed that his/her managed care company has contracts with or will negotiate with certain facilities, including the following:            Patient/family informed of bed offers received.  Patient chooses bed at       Physician recommends and patient chooses bed at      Patient to be transferred to   on  .  Patient to be transferred to facility by       Patient family notified on   of transfer.  Name of family member notified:        PHYSICIAN       Additional Comment:    _______________________________________________ Margarito LinerSarah C Onyinyechi Huante, LCSW 10/08/2015, 2:45 PM

## 2015-10-08 NOTE — NC FL2 (Signed)
[place] MEDICAID FL2 LEVEL OF CARE SCREENING TOOL     IDENTIFICATION  Patient Name: Donna Dalton Birthdate: [birth year] Sex: female Admission Date (Current Location): 10/06/2015  [facility] [place]   Facility and Address:  [facility], [address], [place], [place] [address]      Provider Number: [id number]  Attending Physician Name and Address:  Albertine GratesFang Hulon Ferron, MD  Relative Name and Phone Number:  Ellery PlunkGreg Neises [phone])    Current Level of Care: Hospital Recommended Level of Care: Nursing Facility Prior Approval Number:    Date Approved/Denied:   PASRR Number: [id number]  Discharge Plan: SNF    Current Diagnoses: Patient Active Problem List   Diagnosis Date Noted  . Sepsis (HCC) 10/06/2015  . Dementia with behavioral disturbance 10/06/2015  . Acute encephalopathy 10/06/2015  . Distal radius fracture, right 08/17/2015  . Borderline diabetes mellitus 01/18/2015  . Arteriosclerosis of coronary artery 01/05/2014  . Chronic obstructive pulmonary disease (HCC) 01/05/2014  . Essential (primary) hypertension 01/05/2014  . Acid reflux 01/05/2014  . Cerebrovascular accident, old 01/05/2014  . Amnesia 01/05/2014  . Automatic implantable cardioverter-defibrillator in situ 01/05/2014    Orientation RESPIRATION BLADDER Height & Weight     Self  O2 Incontinent Weight: 114 lb 3.2 oz (51.8 kg) Height:  5\' 9"  (175.3 cm)  BEHAVIORAL SYMPTOMS/MOOD NEUROLOGICAL BOWEL NUTRITION STATUS  Wanderer, Dangerous to self, others or property   Continent Diet, Supplemental (Heart Healthy and Ensure Enlive once daily.)  AMBULATORY STATUS COMMUNICATION OF NEEDS Skin   Limited Assist Verbally PU Stage and Appropriate Care (Dressing: Foam) PU Stage 1 Dressing: BID                     Personal Care Assistance Level of Assistance  Bathing, Dressing, Feeding Bathing Assistance: Maximum assistance Feeding assistance: Independent Dressing  Assistance: Limited assistance     Functional Limitations Info  Sight, Hearing, Speech Sight Info: Adequate Hearing Info: Adequate Speech Info: Adequate    SPECIAL CARE FACTORS FREQUENCY  PT (By licensed PT), OT (By licensed OT), Blood pressure     PT Frequency: Evaluation on 4/13. 5 x week OT Frequency: Evaluation on 4/13. 5 x week            Contractures Contractures Info: Not present    Additional Factors Info  Code Status Code Status Info:  (Full)             Current Medications (10/08/2015):  This is the current hospital active medication list Current Facility-Administered Medications  Medication Dose Route Frequency Provider Last Rate Last Dose  . acetaminophen (TYLENOL) tablet 650 mg  650 mg Oral Q6H PRN Therisa DoyneAnastassia Doutova, MD       Or  . acetaminophen (TYLENOL) suppository 650 mg  650 mg Rectal Q6H PRN Therisa DoyneAnastassia Doutova, MD      . albuterol (PROVENTIL) (2.5 MG/3ML) 0.083% nebulizer solution 2.5 mg  2.5 mg Nebulization Q2H PRN Therisa DoyneAnastassia Doutova, MD      . aspirin chewable tablet 81 mg  81 mg Oral Daily Therisa DoyneAnastassia Doutova, MD   81 mg at 10/08/15 0901  . ceFEPIme (MAXIPIME) 2 g in dextrose 5 % 50 mL IVPB  2 g Intravenous Q24H Juliette MangleVeronda P Bryk, RPH   2 g at 10/07/15 1836  . donepezil (ARICEPT) tablet 5 mg  5 mg Oral QHS Therisa DoyneAnastassia Doutova, MD   5 mg at 10/07/15 2242  . enoxaparin (LOVENOX) injection 30 mg  30 mg Subcutaneous  Q24H Therisa Doyne, MD   30 mg at 10/08/15 0901  . feeding supplement (ENSURE ENLIVE) (ENSURE ENLIVE) liquid 237 mL  237 mL Oral Q1500 Albertine Grates, MD   237 mL at 10/08/15 1437  . guaiFENesin (MUCINEX) 12 hr tablet 600 mg  600 mg Oral BID Therisa Doyne, MD   600 mg at 10/08/15 0901  . HYDROcodone-acetaminophen (NORCO/VICODIN) 5-325 MG per tablet 1-2 tablet  1-2 tablet Oral Q4H PRN Therisa Doyne, MD      . hydrOXYzine (ATARAX/VISTARIL) tablet 10 mg  10 mg Oral Daily PRN Therisa Doyne, MD      . insulin aspart (novoLOG) injection 0-5  Units  0-5 Units Subcutaneous QHS Therisa Doyne, MD   0 Units at 10/07/15 0045  . insulin aspart (novoLOG) injection 0-9 Units  0-9 Units Subcutaneous TID WC Therisa Doyne, MD   2 Units at 10/07/15 1231  . isosorbide mononitrate (ISMO,MONOKET) tablet 10 mg  10 mg Oral BID Therisa Doyne, MD   10 mg at 10/08/15 0901  . nebivolol (BYSTOLIC) tablet 2.5 mg  2.5 mg Oral Daily Therisa Doyne, MD   2.5 mg at 10/08/15 0901  . ondansetron (ZOFRAN) tablet 4 mg  4 mg Oral Q6H PRN Therisa Doyne, MD       Or  . ondansetron (ZOFRAN) injection 4 mg  4 mg Intravenous Q6H PRN Therisa Doyne, MD      . pantoprazole (PROTONIX) EC tablet 40 mg  40 mg Oral Daily Therisa Doyne, MD   40 mg at 10/08/15 0901  . pravastatin (PRAVACHOL) tablet 20 mg  20 mg Oral Daily Therisa Doyne, MD   20 mg at 10/08/15 0901  . sodium chloride flush (NS) 0.9 % injection 3 mL  3 mL Intravenous Q12H Therisa Doyne, MD   3 mL at 10/08/15 1610     Discharge Medications: Please see discharge summary for a list of discharge medications.  Relevant Imaging Results:  Relevant Lab Results:   Additional Information  (SS#: [id number])  Margarito Liner, LCSW

## 2015-10-08 NOTE — Care Management Important Message (Signed)
Important Message  Patient Details  Name: Donna Dalton MRN: [id number] Date of Birth: [birth year]   Medicare Important Message Given:  Yes    Kyla BalzarineShealy, Torrie Lafavor Abena 10/08/2015, 11:37 AM

## 2015-10-08 NOTE — Clinical Social Work Note (Signed)
Clinical Social Work Assessment  Patient Details  Name: Donna Dalton MRN: [id number] Date of Birth: [birth year]  Date of referral:  10/08/15               Reason for consult:  Facility Placement                Permission sought to share information with:  Case Manager, Magazine features editoracility Contact Representative, Family Supports Permission granted to share information::  Yes, Verbal Permission Granted  Name::     Donna Dalton  Agency::  SNF's  Relationship::  Son  Contact Information:  [phone]  Housing/Transportation Living arrangements for the past 2 months:  Assisted Living Facility Source of Information:  Adult Children Patient Interpreter Needed:  None Criminal Activity/Legal Involvement Pertinent to Current Situation/Hospitalization:  No - Comment as needed Significant Relationships:  Adult Children Lives with:  Facility Resident Do you feel safe going back to the place where you live?    Need for family participation in patient care:  Yes (Comment)  Care giving concerns:  Patient requiring SNF placement.   Social Worker assessment / plan:  Patient was not fully oriented, per her assessment so CSW called her son, Donna Dalton [phone]). Introduced role and explained that CSW was calling to discuss discharge planning. Explained to patient's son that there was a recommendation for her to go to a SNF. He said that he does not want her in a short-term facility because of current memory issues and wants [facility] to [facility] to [facility] ([facility] There is no 24/7 care available there and Donna Dalton is not willing to pay for it out of pocket. He hired aids through [employer] but they are not able to start until May 1. Patient's son said he could call and see if they could start a few days early. CSW asked patient's son when he would return to the hospital and he stated that he could come back soon. When patient's son later returned, CSW entered the room and introduced self to patient and her son.  Patient's son reported that he called [facility] and they told him they had private nurses there that did not currently have patents. Patient's son stated that he wanted Medicare to pay for any service she received. CSW provided a list of SNF's in [place] and surrounding counties in case he later decided on facility placement and that Medicare will pay for it. Patient's son stated that he is "90% against" her going to a SNF. CSW explained to patient's son that the Case Manager will come in and talk with them about home care options. CSW discussed situation with Case Manager in another office and she then went to speak with them. After speaking with patient and her son, the Case Manager stated that patient is willing to go to a SNF and her son is agreeable to this plan. CSW went back in the patient's room and discussed preferences for SNF's.  Employment status:  Retired Health and safety inspectornsurance information:  Medicare PT Recommendations:  Skilled Nursing Facility Information / Referral to community resources:  Skilled Nursing Facility  Patient/Family's Response to care:  Patient was not fully oriented to discussion. Son was willing to discuss SNF placement as a last option. After speaking with the case manager, the patient told her that she was willing to go to a SNF and patient's son was agreeable to this as long as insurance paid for it.  Patient/Family's Understanding of and Emotional Response to Diagnosis, Current Treatment, and Prognosis:  Patient  appeared to understand SNF placement after further discussion. Her son reports understanding diagnosis, current treatment, and prognosis.  Emotional Assessment Appearance:  Appears stated age Attitude/Demeanor/Rapport:   (None) Affect (typically observed):  Accepting, Appropriate Orientation:  Oriented to Self Alcohol / Substance use:  Never Used Psych involvement (Current and /or in the community):  No (Comment)  Discharge Needs  Concerns to be addressed:  Care  Coordination, Cognitive Concerns Readmission within the last 30 days:  No Current discharge risk:  Cognitively Impaired, Physical Impairment Barriers to Discharge:  Continued Medical Work up   Donna Liner, LCSW 10/08/2015, 2:19 PM

## 2015-10-08 NOTE — Progress Notes (Signed)
[facility] PROGRESS NOTE  Donna Dalton ZOX:[id number] DOB: 1926-05-21 DOA: 10/06/2015 PCP: Doreatha Martin, MD  Brief narrative 82 year old female with history of coronary artery disease, hypertension, GERD, COPD on home O2, history of stroke and dementia brought to the ED as she was found to be acutely confused, naked in her apartment, febrile and hypoxic to 86% on 2 L via nasal cannula. She was brought to [facility] which was found to be septic with UTI, with fever and elevated lactic acid. Admit to hospitalist service.   Assessment/Plan: Sepsis with acute encephalopathy secondary to UTI/g- bacteremia Was started on empiric vancomycin and cefepime. Cultures growing gram-negative rods. Narrowed to IV cefepime only. Follow urine culture blood cultures. Continue IV hydration. Sepsis currently resolved. (Normal WBC, vitals stable and lactic acid resolved).  Diabetes mellitus Diet controlled. Monitor on sliding coverage.  COPD Continue O2 via nasal cannula. Stable. Continue when necessary albuterol nebs.   Coronary artery disease Continue aspirin, beta blocker, statin.  Dementia Continue Aricept.  ? Protein calorie malnutrition Nutrition evaluation.  DVT prophylaxis: Subcutaneous Lovenox  Diet: Heart healthy   Code Status: full Code Family Communication: None at bedside Disposition Plan: Pending clinical improvement. PT recommends skilled nursing facility.   Consultants:  None  Procedures:  None  Antibiotics:  IV vancomycin 1  IV Zosyn 4/12-  HPI/Subjective: Seen and examined. Remains afebrile this morning. Not in distress. Has confusion.   Objective: Filed Vitals:   10/08/15 1500 10/08/15 2006  BP: 147/70 128/55  Pulse: 69 71  Temp: 98.4 F (36.9 C) 98.2 F (36.8 C)  Resp: 16 17    Intake/Output Summary (Last 24 hours) at 10/08/15 2132 Last data filed at 10/08/15 1842  Gross per 24 hour  Intake    490 ml  Output      1 ml  Net     489 ml   Filed Weights   10/06/15 2153 10/07/15 2100 10/08/15 2006  Weight: 51.7 kg (113 lb 15.7 oz) 51.8 kg (114 lb 3.2 oz) 51.9 kg (114 lb 6.7 oz)    Exam:   General: Elderly female appears fatigued, not in distress  HEENT: No pallor, moist mucosa, supple neck  Chest: Clear bilaterally  CVS: Normal S1 and S2, no murmurs rub or gallop  GI: Soft, nondistended, nontender, bowel sounds present  Musculoskeletal: Warm, no edema  CNS: Alert noted to 1-2, nonfocal  Data Reviewed: Basic Metabolic Panel:  Recent Labs Lab 10/06/15 1827 10/07/15 0655  NA 138 142  K 4.2 3.9  CL 103 107  CO2 27 27  GLUCOSE 144* 104*  BUN 24* 18  CREATININE 1.05* 0.77  CALCIUM 8.7* 8.0*  MG  --  2.0  PHOS  --  3.3   Liver Function Tests:  Recent Labs Lab 10/06/15 1827 10/07/15 0655  AST 25 27  ALT 11* 15  ALKPHOS 124 97  BILITOT 0.9 0.7  PROT 6.8 4.9*  ALBUMIN 3.2* 2.1*   No results for input(s): LIPASE, AMYLASE in the last 168 hours. No results for input(s): AMMONIA in the last 168 hours. CBC:  Recent Labs Lab 10/06/15 1827 10/07/15 0655 10/08/15 0612  WBC 13.9* 10.1 7.1  HGB 15.3* 13.4 13.5  HCT 46.6* 41.6 42.1  MCV 97.9 99.3 98.4  PLT 115* 104* 85*   Cardiac Enzymes:  Recent Labs Lab 10/07/15 0224 10/07/15 0655 10/07/15 1322  TROPONINI 0.04* 0.03 0.03   BNP (last 3 results) No results for input(s): BNP in the last 8760 hours.  ProBNP (last 3 results) No results for input(s): PROBNP in the last 8760 hours.  CBG:  Recent Labs Lab 10/07/15 2129 10/08/15 0834 10/08/15 1107 10/08/15 1649 10/08/15 2005  GLUCAP 94 91 83 97 85    Recent Results (from the past 240 hour(s))  Blood culture (routine x 2)     Status: None (Preliminary result)   Collection Time: 10/06/15  6:30 PM  Result Value Ref Range Status   Specimen Description BLOOD RIGHT ANTECUBITAL  Final   Special Requests   Final    BOTTLES DRAWN AEROBIC AND ANAEROBIC 5CC Performed at [facility]  [place]    Culture  Setup Time   Final    GRAM NEGATIVE RODS IN BOTH AEROBIC AND ANAEROBIC BOTTLES CRITICAL RESULT CALLED TO, READ BACK BY AND VERIFIED WITH: R HILL,RN AT 0901 10/07/15 BY L BENFIELD    Culture GRAM NEGATIVE RODS  Final   Report Status PENDING  Incomplete  Blood culture (routine x 2)     Status: None (Preliminary result)   Collection Time: 10/06/15  7:25 PM  Result Value Ref Range Status   Specimen Description BLOOD LEFT ANTECUBITAL  Final   Special Requests BOTTLES DRAWN AEROBIC AND ANAEROBIC 3CC  Final   Culture  Setup Time   Final    GRAM NEGATIVE RODS AEROBIC BOTTLE ONLY CRITICAL RESULT CALLED TO, READ BACK BY AND VERIFIED WITH: Anthoney HaradaL. CARMAN,RN [id number] ON [id number] BY Lucienne CapersS. YARBROUGH Performed at [facility]    Culture GRAM NEGATIVE RODS  Final   Report Status PENDING  Incomplete  MRSA PCR Screening     Status: None   Collection Time: 10/07/15  1:40 AM  Result Value Ref Range Status   MRSA by PCR NEGATIVE NEGATIVE Final    Comment:        The GeneXpert MRSA Assay (FDA approved for NASAL specimens only), is one component of a comprehensive MRSA colonization surveillance program. It is not intended to diagnose MRSA infection nor to guide or monitor treatment for MRSA infections.      Studies: No results found.  Scheduled Meds: . aspirin  81 mg Oral Daily  . ceFEPime (MAXIPIME) IV  2 g Intravenous Q24H  . donepezil  5 mg Oral QHS  . enoxaparin (LOVENOX) injection  30 mg Subcutaneous Q24H  . feeding supplement (ENSURE ENLIVE)  237 mL Oral Q1500  . guaiFENesin  600 mg Oral BID  . insulin aspart  0-5 Units Subcutaneous QHS  . insulin aspart  0-9 Units Subcutaneous TID WC  . isosorbide mononitrate  10 mg Oral BID  . nebivolol  2.5 mg Oral Daily  . pantoprazole  40 mg Oral Daily  . pravastatin  20 mg Oral Daily  . sodium chloride flush  3 mL Intravenous Q12H   Continuous Infusions:     Time spent: 25 minutes    Leani Myron MD PhD  [facility] Pager [phone]. If 7PM-7AM, please contact night-coverage at [email or web address], password [email or web address] 10/08/2015, 9:32 PM  LOS: 2 days

## 2015-10-08 NOTE — Clinical Social Work Note (Signed)
CSW received call from [facility]. They will not be able to extend a bed offer for patient. CSW will continue to follow and assist with any SW needs and discharge to a skilled facility when medically stable.   Genelle BalVanessa Johnattan Strassman, MSW, LCSW Licensed Clinical Social Worker Clinical Social Work Department [facility] [phone]

## 2015-10-08 NOTE — Progress Notes (Signed)
Initial Nutrition Assessment  DOCUMENTATION CODES:   Severe malnutrition in context of chronic illness, Underweight  INTERVENTION:  Provide Ensure Enlive po once daily, each supplement provides 350 kcal and 20 grams of protein.  Encourage adequate PO intake.   NUTRITION DIAGNOSIS:   Malnutrition related to chronic illness as evidenced by severe depletion of body fat, severe depletion of muscle mass.  GOAL:   Patient will meet greater than or equal to 90% of their needs  MONITOR:   PO intake, Supplement acceptance, Weight trends, Labs, I & O's, Skin  REASON FOR ASSESSMENT:   Malnutrition Screening Tool    ASSESSMENT:   82 year old female with history of coronary artery disease, hypertension, GERD, COPD on home O2, history of stroke and dementia brought to the ED as she was found to be acutely confused, naked in her apartment, febrile and hypoxic to 86% on 2 L via nasal cannula. She was brought to [facility] which was found to be septic with UTI, with fever and elevated lactic acid. Admit to hospitalist service.    Pt reports having a good appetite currently and PTA with consumption of at least 3 meals a day. Current meal completion has been 75-100%. Usual body weight reported to be ~124 lbs, however pt reports she is not quite sure. No family at bedside. Pt is agreeable to Ensure to aid in caloric and protein needs as well as in wound healing. RD to order. Pt was encouraged to eat her food at meals.   Nutrition-Focused physical exam completed. Findings are severe fat depletion, severe muscle depletion, and mild edema.   Labs an medications reviewed.   Diet Order:  Diet Heart Room service appropriate?: Yes; Fluid consistency:: Thin  Skin:  Wound (see comment) (Stage I pressure ulcer on sacrum)  Last BM:  Unknown  Height:   Ht Readings from Last 1 Encounters:  10/07/15 5\' 9"  (1.753 m)    Weight:   Wt Readings from Last 1 Encounters:  10/07/15 114 lb 3.2 oz  (51.8 kg)    Ideal Body Weight:  65.9 kg  BMI:  Body mass index is 16.86 kg/(m^2).  Estimated Nutritional Needs:   Kcal:  1550-1750  Protein:  75-85 grams  Fluid:  1.5 - 1.7 L/day  EDUCATION NEEDS:   No education needs identified at this time  Roslyn SmilingStephanie Anjalee Cope, MS, RD, LDN Pager # [phone] After hours/ weekend pager # [phone]

## 2015-10-09 DIAGNOSIS — J438 Other emphysema: Secondary | ICD-10-CM

## 2015-10-09 DIAGNOSIS — Z9981 Dependence on supplemental oxygen: Secondary | ICD-10-CM

## 2015-10-09 DIAGNOSIS — J9611 Chronic respiratory failure with hypoxia: Secondary | ICD-10-CM

## 2015-10-09 DIAGNOSIS — Z95810 Presence of automatic (implantable) cardiac defibrillator: Secondary | ICD-10-CM

## 2015-10-09 DIAGNOSIS — E43 Unspecified severe protein-calorie malnutrition: Secondary | ICD-10-CM | POA: Diagnosis present

## 2015-10-09 DIAGNOSIS — B952 Enterococcus as the cause of diseases classified elsewhere: Secondary | ICD-10-CM | POA: Diagnosis present

## 2015-10-09 DIAGNOSIS — I2510 Atherosclerotic heart disease of native coronary artery without angina pectoris: Secondary | ICD-10-CM

## 2015-10-09 DIAGNOSIS — N390 Urinary tract infection, site not specified: Secondary | ICD-10-CM

## 2015-10-09 DIAGNOSIS — R7881 Bacteremia: Secondary | ICD-10-CM | POA: Diagnosis present

## 2015-10-09 DIAGNOSIS — B9620 Unspecified Escherichia coli [E. coli] as the cause of diseases classified elsewhere: Secondary | ICD-10-CM

## 2015-10-09 LAB — BASIC METABOLIC PANEL
ANION GAP: 6 (ref 5–15)
BUN: 17 mg/dL (ref 6–20)
CALCIUM: 8.1 mg/dL — AB (ref 8.9–10.3)
CO2: 28 mmol/L (ref 22–32)
Chloride: 106 mmol/L (ref 101–111)
Creatinine, Ser: 0.74 mg/dL (ref 0.44–1.00)
Glucose, Bld: 82 mg/dL (ref 65–99)
Potassium: 3.8 mmol/L (ref 3.5–5.1)
Sodium: 140 mmol/L (ref 135–145)

## 2015-10-09 LAB — CBC
HCT: 38.9 % (ref 36.0–46.0)
Hemoglobin: 12.6 g/dL (ref 12.0–15.0)
MCH: 32.3 pg (ref 26.0–34.0)
MCHC: 32.4 g/dL (ref 30.0–36.0)
MCV: 99.7 fL (ref 78.0–100.0)
Platelets: 96 10*3/uL — ABNORMAL LOW (ref 150–400)
RBC: 3.9 MIL/uL (ref 3.87–5.11)
RDW: 15.1 % (ref 11.5–15.5)
WBC: 5.4 10*3/uL (ref 4.0–10.5)

## 2015-10-09 LAB — URINE CULTURE: Culture: NO GROWTH

## 2015-10-09 LAB — GLUCOSE, CAPILLARY
GLUCOSE-CAPILLARY: 124 mg/dL — AB (ref 65–99)
GLUCOSE-CAPILLARY: 149 mg/dL — AB (ref 65–99)
Glucose-Capillary: 86 mg/dL (ref 65–99)

## 2015-10-09 LAB — MAGNESIUM: Magnesium: 2 mg/dL (ref 1.7–2.4)

## 2015-10-09 MED ORDER — VANCOMYCIN HCL IN DEXTROSE 750-5 MG/150ML-% IV SOLN
750.0000 mg | Freq: Once | INTRAVENOUS | Status: DC
  Filled 2015-10-09: qty 150

## 2015-10-09 MED ORDER — VANCOMYCIN HCL IN DEXTROSE 750-5 MG/150ML-% IV SOLN
750.0000 mg | INTRAVENOUS | Status: DC
  Administered 2015-10-10 – 2015-10-12 (×3): 750 mg via INTRAVENOUS
  Filled 2015-10-09 (×4): qty 150

## 2015-10-09 NOTE — Progress Notes (Signed)
CRITICAL VALUE ALERT  Critical value received:  Blood culture = previously was e. Coli +, now enteroccocus +.  Date of notification:  10-09-2015   Time of notification:  16:58  Critical value read back:Yes.    Nurse who received alert:  Meridee ScoreAndy Vicy Medico, RN  MD notified (1st page):  Roda ShuttersXu  Time of first page:  16:59  MD notified (2nd page):  Time of second page:  Responding MD:  Roda ShuttersXu  Time MD responded:  17:08

## 2015-10-09 NOTE — Progress Notes (Signed)
[facility] PROGRESS NOTE  Donna Dalton WJX:[id number] DOB: 1926/03/23 DOA: 10/06/2015 PCP: Doreatha Martin, MD  Brief narrative 82 year old female with history of coronary artery disease, hypertension, GERD, COPD on home O2, history of stroke and dementia brought to the ED as she was found to be acutely confused, naked in her apartment, febrile and hypoxic to 86% on 2 L via nasal cannula. She was brought to [facility] which was found to be septic with UTI, with fever and elevated lactic acid. Admit to hospitalist service.   Assessment/Plan:  Sepsis presented on admission with leukocytosis, lactic acidosis,  acute metabolic encephalopathy secondary to ecoli UTI/Ecoli bacteremia/enterococcus in first set of blood culture as well Was started on empiric vancomycin and cefepime on admission, Cultures growing gram-negative rods. Narrowed to IV cefepime only.  Blood culture from 4/12+ ecoli, in addition, + enterococcus, second set of blood culture drawn on 4/15, will get echocardiogram, f/u on final culture result, clinically improving, leukocytosis and lactic acidosis has resolved, mental status has back to baseline per family.   Diabetes mellitus, a1c 5.9 Diet controlled. Monitor on sliding coverage.  COPD with chronic hypoxic respiratory failure on home o2 Continue O2 via nasal cannula. Stable. Continue when necessary albuterol nebs.   Coronary artery disease Continue aspirin, beta blocker, statin.  s/pMedtronic CRT-D / Bi-VentricularDefibrillator , Following MD:Dr. Chales Abrahams at [facility], device last checked in 07/2015  Dementia Continue Aricept. Not oriented to time but to person and know she is in the hospital  ? Protein calorie malnutrition Nutrition evaluation.  DVT prophylaxis: Subcutaneous Lovenox  Diet: Heart healthy/carb modified   Code Status: full Code Family Communication: two sons in room Disposition Plan: Pending final blood culture result and echo,  need snf   Consultants:  None  Procedures:  None  Antibiotics:  IV vancomycin 1 on 4/12  IV cefepime 4/12-  HPI/Subjective: Seen and examined. Denies pain, know she is in the hospital, know her son's name. No fever, no agitation  Objective: Filed Vitals:   10/09/15 0608 10/09/15 0953  BP: 150/60 142/61  Pulse: 70 72  Temp: 97.8 F (36.6 C) 98 F (36.7 C)  Resp: 18 18    Intake/Output Summary (Last 24 hours) at 10/09/15 1712 Last data filed at 10/09/15 1300  Gross per 24 hour  Intake    560 ml  Output      0 ml  Net    560 ml   Filed Weights   10/06/15 2153 10/07/15 2100 10/08/15 2006  Weight: 51.7 kg (113 lb 15.7 oz) 51.8 kg (114 lb 3.2 oz) 51.9 kg (114 lb 6.7 oz)    Exam:   General: Elderly female, not in distress  HEENT: No pallor, moist mucosa, supple neck  Chest: Clear bilaterally  CVS: Normal S1 and S2, no murmurs rub or gallop  GI: Soft, nondistended, nontender, bowel sounds present  Musculoskeletal:bilateral mild pedal edema  CNS: Alert noted to 1-2, nonfocal  Data Reviewed: Basic Metabolic Panel:  Recent Labs Lab 10/06/15 1827 10/07/15 0655 10/09/15 0012  NA 138 142 140  K 4.2 3.9 3.8  CL 103 107 106  CO2 GLUCOSE 144* 104* 82  BUN 24* 18 17  CREATININE 1.05* 0.77 0.74  CALCIUM 8.7* 8.0* 8.1*  MG  --  2.0 2.0  PHOS  --  3.3  --    Liver Function Tests:  Recent Labs Lab 10/06/15 1827 10/07/15 0655  AST 25 27  ALT 11* 15  ALKPHOS 124 97  BILITOT 0.9 0.7  PROT 6.8 4.9*  ALBUMIN 3.2* 2.1*   No results for input(s): LIPASE, AMYLASE in the last 168 hours. No results for input(s): AMMONIA in the last 168 hours. CBC:  Recent Labs Lab 10/06/15 1827 10/07/15 0655 10/08/15 0612 10/09/15 0012  WBC 13.9* 10.1 7.1 5.4  HGB 15.3* 13.4 13.5 12.6  HCT 46.6* 41.6 42.1 38.9  MCV 97.9 99.3 98.4 99.7  PLT 115* 104* 85* 96*   Cardiac Enzymes:  Recent Labs Lab 10/07/15 0224 10/07/15 0655 10/07/15 1322   TROPONINI 0.04* 0.03 0.03   BNP (last 3 results) No results for input(s): BNP in the last 8760 hours.  ProBNP (last 3 results) No results for input(s): PROBNP in the last 8760 hours.  CBG:  Recent Labs Lab 10/08/15 1107 10/08/15 1649 10/08/15 2005 10/09/15 1140 10/09/15 1701  GLUCAP 83 97 85 124* 149*    Recent Results (from the past 240 hour(s))  Blood culture (routine x 2)     Status: Abnormal (Preliminary result)   Collection Time: 10/06/15  6:30 PM  Result Value Ref Range Status   Specimen Description BLOOD RIGHT ANTECUBITAL  Final   Special Requests BOTTLES DRAWN AEROBIC AND ANAEROBIC 5CC  Final   Culture  Setup Time   Final    GRAM NEGATIVE RODS IN BOTH AEROBIC AND ANAEROBIC BOTTLES CRITICAL RESULT CALLED TO, READ BACK BY AND VERIFIED WITH: R HILL,RN AT 0901 10/07/15 BY L BENFIELD    Culture (A)  Final    ESCHERICHIA COLI ENTEROCOCCUS SPECIES SUSCEPTIBILITIES TO FOLLOW CRITICAL RESULT CALLED TO, READ BACK BY AND VERIFIED WITH: A. BRAKE,RN AT 1654 ON [id number] BY Lucienne Capers Performed at [facility]    Report Status PENDING  Incomplete   Organism ID, Bacteria ESCHERICHIA COLI  Final      Susceptibility   Escherichia coli - MIC*    AMPICILLIN >=32 RESISTANT Resistant     CEFAZOLIN <=4 SENSITIVE Sensitive     CEFEPIME <=1 SENSITIVE Sensitive     CEFTAZIDIME <=1 SENSITIVE Sensitive     CEFTRIAXONE <=1 SENSITIVE Sensitive     CIPROFLOXACIN <=0.25 SENSITIVE Sensitive     GENTAMICIN <=1 SENSITIVE Sensitive     IMIPENEM <=0.25 SENSITIVE Sensitive     TRIMETH/SULFA <=20 SENSITIVE Sensitive     AMPICILLIN/SULBACTAM 8 SENSITIVE Sensitive     PIP/TAZO <=4 SENSITIVE Sensitive     * ESCHERICHIA COLI  Blood culture (routine x 2)     Status: Abnormal (Preliminary result)   Collection Time: 10/06/15  7:25 PM  Result Value Ref Range Status   Specimen Description BLOOD LEFT ANTECUBITAL  Final   Special Requests BOTTLES DRAWN AEROBIC AND ANAEROBIC 3CC  Final    Culture  Setup Time   Final    GRAM NEGATIVE RODS AEROBIC BOTTLE ONLY CRITICAL RESULT CALLED TO, READ BACK BY AND VERIFIED WITH: Anthoney Harada [id number] ON [id number] BY Lucienne Capers    Culture (A)  Final    ESCHERICHIA COLI SUSCEPTIBILITIES PERFORMED ON PREVIOUS CULTURE WITHIN THE LAST 5 DAYS. Performed at [facility]    Report Status PENDING  Incomplete  MRSA PCR Screening     Status: None   Collection Time: 10/07/15  1:40 AM  Result Value Ref Range Status   MRSA by PCR NEGATIVE NEGATIVE Final    Comment:        The GeneXpert MRSA Assay (FDA approved for NASAL specimens only), is one component of a comprehensive MRSA colonization surveillance program. It is not  intended to diagnose MRSA infection nor to guide or monitor treatment for MRSA infections.   Urine culture     Status: None   Collection Time: 10/08/15  2:18 AM  Result Value Ref Range Status   Specimen Description URINE, CLEAN CATCH  Final   Special Requests NONE  Final   Culture NO GROWTH 1 DAY  Final   Report Status 10/09/2015 FINAL  Final     Studies: No results found.  Scheduled Meds: . aspirin  81 mg Oral Daily  . ceFEPime (MAXIPIME) IV  2 g Intravenous Q24H  . donepezil  5 mg Oral QHS  . enoxaparin (LOVENOX) injection  30 mg Subcutaneous Q24H  . feeding supplement (ENSURE ENLIVE)  237 mL Oral Q1500  . guaiFENesin  600 mg Oral BID  . insulin aspart  0-5 Units Subcutaneous QHS  . insulin aspart  0-9 Units Subcutaneous TID WC  . isosorbide mononitrate  10 mg Oral BID  . nebivolol  2.5 mg Oral Daily  . pantoprazole  40 mg Oral Daily  . pravastatin  20 mg Oral Daily  . sodium chloride flush  3 mL Intravenous Q12H   Continuous Infusions:     Time spent: 35 minutes    Gary Gabrielsen MD PhD  [facility] Pager [phone]. If 7PM-7AM, please contact night-coverage at [email or web address], password [email or web address] 10/09/2015, 5:12 PM  LOS: 3 days

## 2015-10-09 NOTE — Consult Note (Signed)
[facility]    Date of Admission:  10/06/2015   1 dose of vancomycin on 10/06/2015        Day 4 cefepime              Reason for Consult: Automatic consultation for enterococcal bacteremia    Referring Physician: Dr. Albertine GratesFang Xu  Active Problems:   Sepsis (HCC)   Bacteremia due to Escherichia coli   Enterococcal bacteremia   Arteriosclerosis of coronary artery   Automatic implantable cardioverter-defibrillator in situ   Dementia with behavioral disturbance   Acute encephalopathy   Chronic obstructive pulmonary disease (HCC)   Essential (primary) hypertension   Borderline diabetes mellitus   Protein-calorie malnutrition, severe   . aspirin  81 mg Oral Daily  . ceFEPime (MAXIPIME) IV  2 g Intravenous Q24H  . donepezil  5 mg Oral QHS  . enoxaparin (LOVENOX) injection  30 mg Subcutaneous Q24H  . feeding supplement (ENSURE ENLIVE)  237 mL Oral Q1500  . guaiFENesin  600 mg Oral BID  . insulin aspart  0-5 Units Subcutaneous QHS  . insulin aspart  0-9 Units Subcutaneous TID WC  . isosorbide mononitrate  10 mg Oral BID  . nebivolol  2.5 mg Oral Daily  . pantoprazole  40 mg Oral Daily  . pravastatin  20 mg Oral Daily  . sodium chloride flush  3 mL Intravenous Q12H    Recommendations: 1. Continue cefepime pending final culture results 2. Restart vancomycin 3. Await results of repeat blood cultures 4. Transthoracic echocardiogram   Assessment: She has polymicrobial bacteremia with Escherichia coli and enterococcus, most likely from a symptomatic urinary tract infection. She has no evidence of pneumonia. I will continue cefepime and restart vancomycin pending antibiotic susceptibility results. Repeat blood cultures have been ordered. She has an implantable defibrillator. I will order a transthoracic echocardiogram.    HPI: Donna Dalton is a 82 y.o. female with dementia who was found confused and undressed in her home. She is brought to the  hospital where she was found to be febrile. Urinalysis revealed pyuria and hematuria. She was started on vancomycin and cefepime. Her urine culture is negative. Escherichia coli has grown from both sets of blood cultures. Enterococcus is growing from one set. She has defervesced.  Review of Systems: Review of Systems  Unable to perform ROS: mental acuity    Past Medical History  Diagnosis Date  . Coronary artery disease   . Hypertension   . GERD (gastroesophageal reflux disease)   . COPD (chronic obstructive pulmonary disease) (HCC)   . Stroke (HCC)   . Dementia     Social History  Substance Use Topics  . Smoking status: Former Games developermoker  . Smokeless tobacco: None  . Alcohol Use: No    Family History  Problem Relation Age of Onset  . Hypertension Other    Allergies  Allergen Reactions  . Penicillins Rash    Hot feeling all over Has patient had a PCN reaction causing immediate rash, facial/tongue/throat swelling, SOB or lightheadedness with hypotension:YES Has patient had a PCN reaction causing severe rash involving mucus membranes or skin necrosis: NO Has patient had a PCN reaction that required hospitalization NO Has patient had a PCN reaction occurring within the last 10 years: NO If all of the above answers are "NO", then may proceed with Cephalosporin use.      OBJECTIVE: Blood pressure 128/55, pulse 69, temperature 98 F (36.7 C),  temperature source Oral, resp. rate 18, height  (1.753 m), weight 114 lb 6.7 oz (51.9 kg), SpO2 96 %.  Physical Exam  Constitutional:  Alert and in no distress. Confused.  HENT:  Mouth/Throat: No oropharyngeal exudate.  Eyes: Conjunctivae are normal.  Cardiovascular: Normal rate and regular rhythm.   No murmur heard. Pulmonary/Chest: Breath sounds normal. She has no wheezes. She has no rales.  Left chest AICD site appears normal.  Abdominal: Soft. She exhibits no mass. There is no tenderness.  Musculoskeletal: Normal range of  motion.  Neurological: She is alert.  Skin: No rash noted.    Lab Results Lab Results  Component Value Date   WBC 5.4 10/09/2015   HGB 12.6 10/09/2015   HCT 38.9 10/09/2015   MCV 99.7 10/09/2015   PLT 96* 10/09/2015    Lab Results  Component Value Date   CREATININE 0.74 10/09/2015   BUN 17 10/09/2015   NA 140 10/09/2015   K 3.8 10/09/2015   CL 106 10/09/2015   CO2 28 10/09/2015    Lab Results  Component Value Date   ALT 15 10/07/2015   AST 27 10/07/2015   ALKPHOS 97 10/07/2015   BILITOT 0.7 10/07/2015     Microbiology: Recent Results (from the past 240 hour(s))  Blood culture (routine x 2)     Status: Abnormal (Preliminary result)   Collection Time: 10/06/15  6:30 PM  Result Value Ref Range Status   Specimen Description BLOOD RIGHT ANTECUBITAL  Final   Special Requests BOTTLES DRAWN AEROBIC AND ANAEROBIC 5CC  Final   Culture  Setup Time   Final    GRAM NEGATIVE RODS IN BOTH AEROBIC AND ANAEROBIC BOTTLES CRITICAL RESULT CALLED TO, READ BACK BY AND VERIFIED WITH: R HILL,RN AT 0901 10/07/15 BY L BENFIELD    Culture (A)  Final    ESCHERICHIA COLI ENTEROCOCCUS SPECIES SUSCEPTIBILITIES TO FOLLOW CRITICAL RESULT CALLED TO, READ BACK BY AND VERIFIED WITH: A. BRAKE,RN AT 1654 ON [id number] BY Lucienne Capers Performed at [facility]    Report Status PENDING  Incomplete   Organism ID, Bacteria ESCHERICHIA COLI  Final      Susceptibility   Escherichia coli - MIC*    AMPICILLIN >=32 RESISTANT Resistant     CEFAZOLIN <=4 SENSITIVE Sensitive     CEFEPIME <=1 SENSITIVE Sensitive     CEFTAZIDIME <=1 SENSITIVE Sensitive     CEFTRIAXONE <=1 SENSITIVE Sensitive     CIPROFLOXACIN <=0.25 SENSITIVE Sensitive     GENTAMICIN <=1 SENSITIVE Sensitive     IMIPENEM <=0.25 SENSITIVE Sensitive     TRIMETH/SULFA <=20 SENSITIVE Sensitive     AMPICILLIN/SULBACTAM 8 SENSITIVE Sensitive     PIP/TAZO <=4 SENSITIVE Sensitive     * ESCHERICHIA COLI  Blood culture (routine x 2)     Status:  Abnormal (Preliminary result)   Collection Time: 10/06/15  7:25 PM  Result Value Ref Range Status   Specimen Description BLOOD LEFT ANTECUBITAL  Final   Special Requests BOTTLES DRAWN AEROBIC AND ANAEROBIC 3CC  Final   Culture  Setup Time   Final    GRAM NEGATIVE RODS AEROBIC BOTTLE ONLY CRITICAL RESULT CALLED TO, READ BACK BY AND VERIFIED WITH: Anthoney Harada [id number] ON [id number] BY Lucienne Capers    Culture (A)  Final    ESCHERICHIA COLI SUSCEPTIBILITIES PERFORMED ON PREVIOUS CULTURE WITHIN THE LAST 5 DAYS. Performed at [facility]    Report Status PENDING  Incomplete  MRSA PCR Screening  Status: None   Collection Time: 10/07/15  1:40 AM  Result Value Ref Range Status   MRSA by PCR NEGATIVE NEGATIVE Final    Comment:        The GeneXpert MRSA Assay (FDA approved for NASAL specimens only), is one component of a comprehensive MRSA colonization surveillance program. It is not intended to diagnose MRSA infection nor to guide or monitor treatment for MRSA infections.   Urine culture     Status: None   Collection Time: 10/08/15  2:18 AM  Result Value Ref Range Status   Specimen Description URINE, CLEAN CATCH  Final   Special Requests NONE  Final   Culture NO GROWTH 1 DAY  Final   Report Status 10/09/2015 FINAL  Final    Cliffton Asters, MD [facility] [phone] pager   [phone] cell 10/09/2015, 5:19 PM

## 2015-10-09 NOTE — Progress Notes (Addendum)
Pharmacy Antibiotic Note  Donna Dalton is a 82 y.o. female admitted on 10/06/2015 with possible HCAP.  Pharmacy has been consulted for vancomycin and cefepime dosing. afebrile, WBC down to wnl, LA 1<<2.5, SCr wnl (0.74) E.coli and enterococcus growing in urine. Add vancomycin to cefepime.  Plan: - Continue Cefepime 2gm IV Q24H - narrow once bug known - Vancomycin 750mg  q24 hours - F/u renal fxn, C&S, clinical status  Height: 5\' 9"  (175.3 cm) Weight: 114 lb 6.7 oz (51.9 kg) IBW/kg (Calculated) : 66.2  Temp (24hrs), Avg:98.1 F (36.7 C), Min:97.8 F (36.6 C), Max:98.4 F (36.9 C)   Recent Labs Lab 10/06/15 1827 10/06/15 1855 10/07/15 0318 10/07/15 0655 10/08/15 0612 10/09/15 0012  WBC 13.9*  --   --  10.1 7.1 5.4  CREATININE 1.05*  --   --  0.77  --  0.74  LATICACIDVEN  --  2.50* 0.8 1.0  --   --     Estimated Creatinine Clearance: 40.6 mL/min (by C-G formula based on Cr of 0.74).    Allergies  Allergen Reactions  . Penicillins Rash    Hot feeling all over Has patient had a PCN reaction causing immediate rash, facial/tongue/throat swelling, SOB or lightheadedness with hypotension:YES Has patient had a PCN reaction causing severe rash involving mucus membranes or skin necrosis: NO Has patient had a PCN reaction that required hospitalization NO Has patient had a PCN reaction occurring within the last 10 years: NO If all of the above answers are "NO", then may proceed with Cephalosporin use.     Cefepime 4/12>>  Vanc x 1 4/12   4/14 UCx: sent  4/13 MRSA PCR neg  4/12 BCx: 2/2 with GNR  Flu neg  Thank you for allowing pharmacy to be a part of this patient's care.  Meagan C. Marvis MoellerMiles, PharmD Pharmacy Resident  Pager: [phone] 10/09/2015 10:55 AM   Addendum: Add vancomycin to cefepime.  Sheppard CoilFrank Bentzion Dauria PharmD., BCPS Clinical Pharmacist Pager [phone] 10/09/2015 5:36 PM

## 2015-10-10 DIAGNOSIS — N390 Urinary tract infection, site not specified: Secondary | ICD-10-CM | POA: Insufficient documentation

## 2015-10-10 DIAGNOSIS — Z9981 Dependence on supplemental oxygen: Secondary | ICD-10-CM | POA: Insufficient documentation

## 2015-10-10 DIAGNOSIS — J9611 Chronic respiratory failure with hypoxia: Secondary | ICD-10-CM | POA: Insufficient documentation

## 2015-10-10 LAB — GLUCOSE, CAPILLARY
GLUCOSE-CAPILLARY: 115 mg/dL — AB (ref 65–99)
Glucose-Capillary: 69 mg/dL (ref 65–99)
Glucose-Capillary: 108 mg/dL — ABNORMAL HIGH (ref 65–99)
Glucose-Capillary: 131 mg/dL — ABNORMAL HIGH (ref 65–99)

## 2015-10-10 LAB — BASIC METABOLIC PANEL
Anion gap: 8 (ref 5–15)
BUN: 12 mg/dL (ref 6–20)
CALCIUM: 8.4 mg/dL — AB (ref 8.9–10.3)
CO2: 30 mmol/L (ref 22–32)
CREATININE: 0.63 mg/dL (ref 0.44–1.00)
Chloride: 104 mmol/L (ref 101–111)
GFR calc Af Amer: >60 mL/min (ref >60)
GLUCOSE: 78 mg/dL (ref 65–99)
POTASSIUM: 3.6 mmol/L (ref 3.5–5.1)
Sodium: 142 mmol/L (ref 135–145)

## 2015-10-10 LAB — CULTURE, BLOOD (ROUTINE X 2)

## 2015-10-10 LAB — CBC
HCT: 45 % (ref 36.0–46.0)
Hemoglobin: 13.9 g/dL (ref 12.0–15.0)
MCH: 30.3 pg (ref 26.0–34.0)
MCHC: 30.9 g/dL (ref 30.0–36.0)
MCV: 98 fL (ref 78.0–100.0)
PLATELETS: 108 10*3/uL — AB (ref 150–400)
RBC: 4.59 MIL/uL (ref 3.87–5.11)
RDW: 15 % (ref 11.5–15.5)
WBC: 5.6 10*3/uL (ref 4.0–10.5)

## 2015-10-10 MED ORDER — CEFTRIAXONE SODIUM 2 G IJ SOLR
2.0000 g | INTRAMUSCULAR | Status: DC
  Administered 2015-10-10 – 2015-10-12 (×3): 2 g via INTRAVENOUS
  Filled 2015-10-10 (×4): qty 2

## 2015-10-10 MED ORDER — FUROSEMIDE 20 MG PO TABS
20.0000 mg | ORAL_TABLET | Freq: Every day | ORAL | Status: AC
  Administered 2015-10-11 – 2015-10-12 (×2): 20 mg via ORAL
  Filled 2015-10-10 (×2): qty 1

## 2015-10-10 MED ORDER — POTASSIUM CHLORIDE CRYS ER 20 MEQ PO TBCR
40.0000 meq | EXTENDED_RELEASE_TABLET | Freq: Once | ORAL | Status: AC
  Administered 2015-10-10: 40 meq via ORAL
  Filled 2015-10-10: qty 2

## 2015-10-10 NOTE — Progress Notes (Addendum)
Patient ID: Donna Dalton, female   DOB: 1926/03/11, 82 y.o.   MRN: [id number]         [facility]    Date of Admission:  10/06/2015           Day 5 cefepime        Day 2 vancomycin  Active Problems:   Sepsis (HCC)   Bacteremia due to Escherichia coli   Enterococcal bacteremia   Arteriosclerosis of coronary artery   Automatic implantable cardioverter-defibrillator in situ   Dementia with behavioral disturbance   Acute encephalopathy   Chronic obstructive pulmonary disease (HCC)   Essential (primary) hypertension   Borderline diabetes mellitus   Protein-calorie malnutrition, severe   . aspirin  81 mg Oral Daily  . ceFEPime (MAXIPIME) IV  2 g Intravenous Q24H  . donepezil  5 mg Oral QHS  . enoxaparin (LOVENOX) injection  30 mg Subcutaneous Q24H  . feeding supplement (ENSURE ENLIVE)  237 mL Oral Q1500  . guaiFENesin  600 mg Oral BID  . insulin aspart  0-5 Units Subcutaneous QHS  . insulin aspart  0-9 Units Subcutaneous TID WC  . isosorbide mononitrate  10 mg Oral BID  . nebivolol  2.5 mg Oral Daily  . pantoprazole  40 mg Oral Daily  . pravastatin  20 mg Oral Daily  . sodium chloride flush  3 mL Intravenous Q12H  . vancomycin  750 mg Intravenous Once  . vancomycin  750 mg Intravenous Q24H    SUBJECTIVE: She states she is feeling well. She denies dysuria. She says that she has had problems with recurrent urinary tract infections in the past but she cannot remember how this affected her. She does not recall anything about her history of penicillin allergy.  Review of Systems: Review of Systems  Unable to perform ROS: mental acuity  Constitutional:       She states that she cannot answer my questions because she cannot remember things.    Past Medical History  Diagnosis Date  . Coronary artery disease   . Hypertension   . GERD (gastroesophageal reflux disease)   . COPD (chronic obstructive pulmonary disease) (HCC)   . Stroke (HCC)   .  Dementia     Social History  Substance Use Topics  . Smoking status: Former Games developer  . Smokeless tobacco: None  . Alcohol Use: No    Family History  Problem Relation Age of Onset  . Hypertension Other    Allergies  Allergen Reactions  . Penicillins Rash    Hot feeling all over Has patient had a PCN reaction causing immediate rash, facial/tongue/throat swelling, SOB or lightheadedness with hypotension:YES Has patient had a PCN reaction causing severe rash involving mucus membranes or skin necrosis: NO Has patient had a PCN reaction that required hospitalization NO Has patient had a PCN reaction occurring within the last 10 years: NO If all of the above answers are "NO", then may proceed with Cephalosporin use.      OBJECTIVE: Filed Vitals:   10/09/15 1718 10/09/15 2009 10/10/15 0925 10/10/15 0945  BP: 128/55 117/46 150/62 126/54  Pulse: 69 71 70 70  Temp: 98 F (36.7 C) 98.1 F (36.7 C) 98.2 F (36.8 C) 98.8 F (37.1 C)  TempSrc: Oral Oral Oral Oral  Resp: Height:      Weight:  113 lb 1.5 oz (51.3 kg)    SpO2: 96% 93% 96% 94%   Body mass index is 16.69  kg/(m^2).  Physical Exam  Constitutional:  She is more alert today. She is sitting up in a chair. She is smiling and cheerful.  HENT:  Mouth/Throat: No oropharyngeal exudate.  Eyes: Conjunctivae are normal.  Cardiovascular: Normal rate and regular rhythm.   No murmur heard. Distant heart sounds.  Pulmonary/Chest: Breath sounds normal.  Left anterior chest AICD site appears normal.  Abdominal: Soft. There is no tenderness.  No CVA tenderness.  Neurological: She is alert.  Skin: No rash noted.  Psychiatric: Mood and affect normal.    Lab Results Lab Results  Component Value Date   WBC 5.6 10/10/2015   HGB 13.9 10/10/2015   HCT 45.0 10/10/2015   MCV 98.0 10/10/2015   PLT 108* 10/10/2015    Lab Results  Component Value Date   CREATININE 0.63 10/10/2015   BUN 12 10/10/2015   NA 142  10/10/2015   K 3.6 10/10/2015   CL 104 10/10/2015   CO2 30 10/10/2015    Lab Results  Component Value Date   ALT 15 10/07/2015   AST 27 10/07/2015   ALKPHOS 97 10/07/2015   BILITOT 0.7 10/07/2015     Microbiology: Recent Results (from the past 240 hour(s))  Blood culture (routine x 2)     Status: Abnormal   Collection Time: 10/06/15  6:30 PM  Result Value Ref Range Status   Specimen Description BLOOD RIGHT ANTECUBITAL  Final   Special Requests BOTTLES DRAWN AEROBIC AND ANAEROBIC 5CC  Final   Culture  Setup Time   Final    GRAM NEGATIVE RODS IN BOTH AEROBIC AND ANAEROBIC BOTTLES CRITICAL RESULT CALLED TO, READ BACK BY AND VERIFIED WITH: R HILL,RN AT 0901 10/07/15 BY L BENFIELD    Culture (A)  Final    ESCHERICHIA COLI ENTEROCOCCUS SPECIES CRITICAL RESULT CALLED TO, READ BACK BY AND VERIFIED WITH: A. BRAKE,RN AT 1654 ON [id number] BY Lucienne Capers Performed at [facility]    Report Status 10/10/2015 FINAL  Final   Organism ID, Bacteria ESCHERICHIA COLI  Final   Organism ID, Bacteria ENTEROCOCCUS SPECIES  Final      Susceptibility   Escherichia coli - MIC*    AMPICILLIN >=32 RESISTANT Resistant     CEFAZOLIN <=4 SENSITIVE Sensitive     CEFEPIME <=1 SENSITIVE Sensitive     CEFTAZIDIME <=1 SENSITIVE Sensitive     CEFTRIAXONE <=1 SENSITIVE Sensitive     CIPROFLOXACIN <=0.25 SENSITIVE Sensitive     GENTAMICIN <=1 SENSITIVE Sensitive     IMIPENEM <=0.25 SENSITIVE Sensitive     TRIMETH/SULFA <=20 SENSITIVE Sensitive     AMPICILLIN/SULBACTAM 8 SENSITIVE Sensitive     PIP/TAZO <=4 SENSITIVE Sensitive     * ESCHERICHIA COLI   Enterococcus species - MIC*    AMPICILLIN <=2 SENSITIVE Sensitive     VANCOMYCIN 1 SENSITIVE Sensitive     GENTAMICIN SYNERGY SENSITIVE Sensitive     * ENTEROCOCCUS SPECIES  Blood culture (routine x 2)     Status: Abnormal   Collection Time: 10/06/15  7:25 PM  Result Value Ref Range Status   Specimen Description BLOOD LEFT ANTECUBITAL  Final    Special Requests BOTTLES DRAWN AEROBIC AND ANAEROBIC 3CC  Final   Culture  Setup Time   Final    GRAM NEGATIVE RODS AEROBIC BOTTLE ONLY CRITICAL RESULT CALLED TO, READ BACK BY AND VERIFIED WITH: Anthoney Harada [id number] ON [id number] BY Lucienne Capers    Culture (A)  Final    ESCHERICHIA COLI SUSCEPTIBILITIES PERFORMED ON  PREVIOUS CULTURE WITHIN THE LAST 5 DAYS. Performed at [facility]    Report Status 10/10/2015 FINAL  Final  MRSA PCR Screening     Status: None   Collection Time: 10/07/15  1:40 AM  Result Value Ref Range Status   MRSA by PCR NEGATIVE NEGATIVE Final    Comment:        The GeneXpert MRSA Assay (FDA approved for NASAL specimens only), is one component of a comprehensive MRSA colonization surveillance program. It is not intended to diagnose MRSA infection nor to guide or monitor treatment for MRSA infections.   Urine culture     Status: None   Collection Time: 10/08/15  2:18 AM  Result Value Ref Range Status   Specimen Description URINE, CLEAN CATCH  Final   Special Requests NONE  Final   Culture NO GROWTH 1 DAY  Final   Report Status 10/09/2015 FINAL  Final     ASSESSMENT: She is improving on therapy for Escherichia coli and enterococcal bacteremia probably complicating a urinary tract infection. Repeat blood cultures and echocardiogram are pending.   PLAN: 1. Change cefepime to ceftriaxone and continue vancomycin 2. Await results of repeat blood cultures and echocardiogram  Cliffton AstersJohn Lavern Crimi, MD [facility] [phone] pager   [phone] cell 10/10/2015, 3:38 PM

## 2015-10-10 NOTE — Progress Notes (Signed)
[facility] PROGRESS NOTE  Donna Dalton ZOX:[id number] DOB: 1925-11-05 DOA: 10/06/2015 PCP: Doreatha Martin, MD  Brief narrative 82 year old female with history of coronary artery disease, hypertension, GERD, COPD on home O2, history of stroke and dementia brought to the ED as she was found to be acutely confused, naked in her apartment, febrile and hypoxic to 86% on 2 L via nasal cannula. She was brought to [facility] which was found to be septic with UTI, with fever and elevated lactic acid. Admit to hospitalist service.    found to have ecoli UTI. ecoli and enterococcus Bacteremia, f/u echo, ID following. Needs skilled nursing facility.  Assessment/Plan:  Sepsis presented on admission with leukocytosis, lactic acidosis,  acute metabolic encephalopathy secondary to ecoli UTI/Ecoli bacteremia/enterococcus in first set of blood culture as well Was started on empiric vancomycin and cefepime on admission, Blood culture from 4/12+ ecoli, in addition, + enterococcus, second set of blood culture drawn on 4/15, will get echocardiogram, f/u on final culture result, clinically improving, leukocytosis and lactic acidosis has resolved, mental status has back to baseline per family. ID following , abx per ID.  Diabetes mellitus, a1c 5.9 Diet controlled. Monitor on sliding coverage.  COPD with chronic hypoxic respiratory failure on home o2 Continue O2 via nasal cannula. Stable. Continue when necessary albuterol nebs.   Coronary artery disease Continue aspirin, beta blocker, statin.  s/pMedtronic CRT-D / Bi-VentricularDefibrillator , Following MD:Dr. [facility]ales Ab[facility]ams at [facility], device last checked in 07/2015  Dementia Continue Aricept. Not oriented to time but to person and know she is in the hospital  ? Protein calorie malnutrition Nutrition evaluation.   Pedal edema: trial of oral lasix x2 days, f/u on echocardiogram.  DVT prophylaxis: Subcutaneous Lovenox  Diet: Heart  healthy/carb modified   Code Status: full Code Family Communication: two sons in room on 4/15 Disposition Plan: Pending final blood culture result and echo, need snf   Consultants: Infectious disease  Procedures:  None  Antibiotics:  IV vancomycin 1 on 4/12, then restarted on 4/15  IV cefepime 4/12-4/16  Rocephin from 4/16  HPI/Subjective: Seen and examined. Denies pain, No fever, no agitation, baseline dementia  Objective: Filed Vitals:   10/10/15 0925 10/10/15 0945  BP: 150/62 126/54  Pulse: 70 70  Temp: 98.2 F (36.8 C) 98.8 F (37.1 C)  Resp: 20 18    Intake/Output Summary (Last 24 hours) at 10/10/15 1604 Last data filed at 10/10/15 0900  Gross per 24 hour  Intake    120 ml  Output      0 ml  Net    120 ml   Filed Weights   10/07/15 2100 10/08/15 2006 10/09/15 2009  Weight: 51.8 kg (114 lb 3.2 oz) 51.9 kg (114 lb 6.7 oz) 51.3 kg (113 lb 1.5 oz)    Exam:   General: Elderly female, not in distress  HEENT: No pallor, moist mucosa, supple neck  Chest: Clear bilaterally  CVS: Normal S1 and S2, no murmurs rub or gallop  GI: Soft, nondistended, nontender, bowel sounds present  Musculoskeletal:bilateral mild pedal edema  CNS: Alert noted to 1-2, nonfocal  Data Reviewed: Basic Metabolic Panel:  Recent Labs Lab 10/06/15 1827 10/07/15 0655 10/09/15 0012 10/10/15 0514  NA 138 142 140 142  K 4.2 3.9 3.8 3.6  CL 103 107 106 104  CO2 GLUCOSE 144* 104* 82 78  BUN 24* CREATININE 1.05* 0.77 0.74 0.63  CALCIUM 8.7* 8.0* 8.1* 8.4*  MG  --  2.0 2.0  --   PHOS  --  3.3  --   --    Liver Function Tests:  Recent Labs Lab 10/06/15 1827 10/07/15 0655  AST 25 27  ALT 11* 15  ALKPHOS 124 97  BILITOT 0.9 0.7  PROT 6.8 4.9*  ALBUMIN 3.2* 2.1*   No results for input(s): LIPASE, AMYLASE in the last 168 hours. No results for input(s): AMMONIA in the last 168 hours. CBC:  Recent Labs Lab 10/06/15 1827 10/07/15 0655  10/08/15 0612 10/09/15 0012 10/10/15 0514  WBC 13.9* 10.1 7.1 5.4 5.6  HGB 15.3* 13.4 13.5 12.6 13.9  HCT 46.6* 41.6 42.1 38.9 45.0  MCV 97.9 99.3 98.4 99.7 98.0  PLT 115* 104* 85* 96* 108*   Cardiac Enzymes:  Recent Labs Lab 10/07/15 0224 10/07/15 0655 10/07/15 1322  TROPONINI 0.04* 0.03 0.03   BNP (last 3 results) No results for input(s): BNP in the last 8760 hours.  ProBNP (last 3 results) No results for input(s): PROBNP in the last 8760 hours.  CBG:  Recent Labs Lab 10/09/15 1140 10/09/15 1701 10/09/15 2002 10/10/15 0720 10/10/15 0943  GLUCAP 124* 149* 86 69 108*    Recent Results (from the past 240 hour(s))  Blood culture (routine x 2)     Status: Abnormal   Collection Time: 10/06/15  6:30 PM  Result Value Ref Range Status   Specimen Description BLOOD RIGHT ANTECUBITAL  Final   Special Requests BOTTLES DRAWN AEROBIC AND ANAEROBIC 5CC  Final   Culture  Setup Time   Final    GRAM NEGATIVE RODS IN BOTH AEROBIC AND ANAEROBIC BOTTLES CRITICAL RESULT CALLED TO, READ BACK BY AND VERIFIED WITH: R HILL,RN AT 0901 10/07/15 BY L BENFIELD    Culture (A)  Final    ESCHERICHIA COLI ENTEROCOCCUS SPECIES CRITICAL RESULT CALLED TO, READ BACK BY AND VERIFIED WITH: A. BRAKE,RN AT 1654 ON [id number] BY Lucienne Capers Performed at [facility]    Report Status 10/10/2015 FINAL  Final   Organism ID, Bacteria ESCHERICHIA COLI  Final   Organism ID, Bacteria ENTEROCOCCUS SPECIES  Final      Susceptibility   Escherichia coli - MIC*    AMPICILLIN >=32 RESISTANT Resistant     CEFAZOLIN <=4 SENSITIVE Sensitive     CEFEPIME <=1 SENSITIVE Sensitive     CEFTAZIDIME <=1 SENSITIVE Sensitive     CEFTRIAXONE <=1 SENSITIVE Sensitive     CIPROFLOXACIN <=0.25 SENSITIVE Sensitive     GENTAMICIN <=1 SENSITIVE Sensitive     IMIPENEM <=0.25 SENSITIVE Sensitive     TRIMETH/SULFA <=20 SENSITIVE Sensitive     AMPICILLIN/SULBACTAM 8 SENSITIVE Sensitive     PIP/TAZO <=4 SENSITIVE Sensitive      * ESCHERICHIA COLI   Enterococcus species - MIC*    AMPICILLIN <=2 SENSITIVE Sensitive     VANCOMYCIN 1 SENSITIVE Sensitive     GENTAMICIN SYNERGY SENSITIVE Sensitive     * ENTEROCOCCUS SPECIES  Blood culture (routine x 2)     Status: Abnormal   Collection Time: 10/06/15  7:25 PM  Result Value Ref Range Status   Specimen Description BLOOD LEFT ANTECUBITAL  Final   Special Requests BOTTLES DRAWN AEROBIC AND ANAEROBIC 3CC  Final   Culture  Setup Time   Final    GRAM NEGATIVE RODS AEROBIC BOTTLE ONLY CRITICAL RESULT CALLED TO, READ BACK BY AND VERIFIED WITH: Anthoney Harada [id number] ON [id number] BY Lucienne Capers    Culture (A)  Final    ESCHERICHIA COLI  SUSCEPTIBILITIES PERFORMED ON PREVIOUS CULTURE WITHIN THE LAST 5 DAYS. Performed at [facility]    Report Status 10/10/2015 FINAL  Final  MRSA PCR Screening     Status: None   Collection Time: 10/07/15  1:40 AM  Result Value Ref Range Status   MRSA by PCR NEGATIVE NEGATIVE Final    Comment:        The GeneXpert MRSA Assay (FDA approved for NASAL specimens only), is one component of a comprehensive MRSA colonization surveillance program. It is not intended to diagnose MRSA infection nor to guide or monitor treatment for MRSA infections.   Urine culture     Status: None   Collection Time: 10/08/15  2:18 AM  Result Value Ref Range Status   Specimen Description URINE, CLEAN CATCH  Final   Special Requests NONE  Final   Culture NO GROWTH 1 DAY  Final   Report Status 10/09/2015 FINAL  Final     Studies: No results found.  Scheduled Meds: . aspirin  81 mg Oral Daily  . cefTRIAXone (ROCEPHIN)  IV  2 g Intravenous Q24H  . donepezil  5 mg Oral QHS  . enoxaparin (LOVENOX) injection  30 mg Subcutaneous Q24H  . feeding supplement (ENSURE ENLIVE)  237 mL Oral Q1500  . guaiFENesin  600 mg Oral BID  . insulin aspart  0-5 Units Subcutaneous QHS  . insulin aspart  0-9 Units Subcutaneous TID WC  . isosorbide mononitrate  10 mg  Oral BID  . nebivolol  2.5 mg Oral Daily  . pantoprazole  40 mg Oral Daily  . pravastatin  20 mg Oral Daily  . sodium chloride flush  3 mL Intravenous Q12H  . vancomycin  750 mg Intravenous Once  . vancomycin  750 mg Intravenous Q24H   Continuous Infusions:     Time spent: 35 minutes    Jacqueleen Pulver MD PhD  [facility] Pager [phone]. If 7PM-7AM, please contact night-coverage at [email or web address], password [email or web address] 10/10/2015, 4:04 PM  LOS: 4 days

## 2015-10-11 ENCOUNTER — Inpatient Hospital Stay (HOSPITAL_COMMUNITY): Payer: Medicare Other

## 2015-10-11 DIAGNOSIS — G9340 Encephalopathy, unspecified: Secondary | ICD-10-CM

## 2015-10-11 DIAGNOSIS — L8990 Pressure ulcer of unspecified site, unspecified stage: Secondary | ICD-10-CM | POA: Insufficient documentation

## 2015-10-11 DIAGNOSIS — R7881 Bacteremia: Secondary | ICD-10-CM

## 2015-10-11 LAB — MAGNESIUM: MAGNESIUM: 2 mg/dL (ref 1.7–2.4)

## 2015-10-11 LAB — BASIC METABOLIC PANEL
Anion gap: 9 (ref 5–15)
BUN: 9 mg/dL (ref 6–20)
CALCIUM: 8.4 mg/dL — AB (ref 8.9–10.3)
CO2: 29 mmol/L (ref 22–32)
Chloride: 104 mmol/L (ref 101–111)
Creatinine, Ser: 0.68 mg/dL (ref 0.44–1.00)
GFR calc non Af Amer: >60 mL/min (ref >60)
Glucose, Bld: 76 mg/dL (ref 65–99)
Potassium: 4.2 mmol/L (ref 3.5–5.1)
SODIUM: 142 mmol/L (ref 135–145)

## 2015-10-11 LAB — ECHOCARDIOGRAM COMPLETE
Height: 69 in
Weight: 1908.3 oz

## 2015-10-11 LAB — GLUCOSE, CAPILLARY
GLUCOSE-CAPILLARY: 95 mg/dL (ref 65–99)
Glucose-Capillary: 67 mg/dL (ref 65–99)
Glucose-Capillary: 83 mg/dL (ref 65–99)

## 2015-10-11 MED ORDER — ENOXAPARIN SODIUM 40 MG/0.4ML ~~LOC~~ SOLN: 40.0000 mg | SUBCUTANEOUS | Status: DC

## 2015-10-11 MED ORDER — ENOXAPARIN SODIUM 40 MG/0.4ML ~~LOC~~ SOLN
40.0000 mg | SUBCUTANEOUS | Status: DC
  Administered 2015-10-12 – 2015-10-13 (×2): 40 mg via SUBCUTANEOUS
  Filled 2015-10-11 (×2): qty 0.4

## 2015-10-11 NOTE — Progress Notes (Signed)
  Echocardiogram 2D Echocardiogram has been performed.  Arvil ChacoFoster, Nikitta Sobiech 10/11/2015, 9:58 AM

## 2015-10-11 NOTE — Clinical Social Work Note (Signed)
Son, Ellery PlunkGreg Turbyfill [phone]) contacted and facility responses provided. [facility]. Tresa EndoKelly, admissions director contacted and advised of facility choice and anticipated d/c on Tuesday.  Genelle BalVanessa Garren Greenman, MSW, LCSW Licensed Clinical Social Worker Clinical Social Work Department [facility] [phone]

## 2015-10-11 NOTE — Progress Notes (Addendum)
[facility] PROGRESS NOTE  Donna Dalton WUJ:[id number] DOB: [birth year] DOA: 10/06/2015 PCP: Doreatha MartinVELAZQUEZ,GRETCHEN, MD  Brief narrative 82 year old female with history of coronary artery disease, hypertension, GERD, COPD on home O2, history of stroke and dementia brought to the ED as she was found to be acutely confused, naked in her apartment, febrile and hypoxic to 86% on 2 L via nasal cannula. She was brought to [facility] which was found to be septic with UTI, with fever and elevated lactic acid. Admit to hospitalist service.    found to have ecoli UTI. ecoli and enterococcus Bacteremia, f/u echo, ID following. Needs skilled nursing facility.  Assessment/Plan:  Sepsis presented on admission with leukocytosis, lactic acidosis,  acute metabolic encephalopathy secondary to ecoli UTI/Ecoli bacteremia/enterococcus in first set of blood culture as well Was started on empiric vancomycin and cefepime on admission,, cefepime changed to ceftriaxone Blood culture from 4/12+ ecoli, in addition, + enterococcus, second set of blood culture drawn on 4/15, echocardiogram pending, f/u on final culture result from 4/15, clinically improving, leukocytosis and lactic acidosis has resolved, mental status has back to baseline per family. ID following , discussed with Dr. Orvan Falconerampbell, patient would need 2 weeks of IV antibiotics due to presence of AICD  Diabetes mellitus, a1c 5.9 Diet controlled. Monitor on sliding coverage.  COPD with chronic hypoxic respiratory failure on home o2 Continue O2 via nasal cannula. Stable. Continue when necessary albuterol nebs.   Coronary artery disease Continue aspirin, beta blocker, statin.  s/pMedtronic CRT-D / Bi-VentricularDefibrillator , Following MD:[facility], device last checked in 07/2015  Dementia Continue Aricept. Not oriented to time but to person and know she is in the hospital  ? Protein calorie malnutrition Nutrition  evaluation.   Pedal edema: trial of oral lasix x2 days, f/u on echocardiogram.  DVT prophylaxis: Subcutaneous Lovenox  Diet: Heart healthy/carb modified   Code Status: full Code Family Communication: two sons in room on 4/15 Disposition Plan: Pending final blood culture result and echo, need snf, anticipate discharge in the next 2-3 days, PICC line tomorrow?   Consultants: Infectious disease  Procedures:  None  Antibiotics:  IV vancomycin 1 on 4/12, then restarted on 4/15  IV cefepime 4/12-4/16  Rocephin from 4/16  HPI/Subjective:   No fever, no agitation, baseline dementia  Objective: Filed Vitals:   10/11/15 0526 10/11/15 0740  BP: 125/87 159/71  Pulse: 70 70  Temp: 98.4 F (36.9 C) 98.2 F (36.8 C)  Resp: 19 18    Intake/Output Summary (Last 24 hours) at 10/11/15 0840 Last data filed at 10/11/15 0741  Gross per 24 hour  Intake    990 ml  Output      0 ml  Net    990 ml   Filed Weights   10/08/15 2006 10/09/15 2009 10/10/15 2116  Weight: 51.9 kg (114 lb 6.7 oz) 51.3 kg (113 lb 1.5 oz) 54.1 kg (119 lb 4.3 oz)    Exam:   General: Elderly female, not in distress  HEENT: No pallor, moist mucosa, supple neck  Chest: Clear bilaterally  CVS: Normal S1 and S2, no murmurs rub or gallop  GI: Soft, nondistended, nontender, bowel sounds present  Musculoskeletal:bilateral mild pedal edema  CNS: Alert noted to 1-2, nonfocal  Data Reviewed: Basic Metabolic Panel:  Recent Labs Lab 10/06/15 1827 10/07/15 0655 10/09/15 0012 10/10/15 0514 10/11/15 0601  NA 138 142 140 142 142  K 4.2 3.9 3.8 3.6 4.2  CL 103 107 106 104 104  CO2 GLUCOSE 144* 104* 82 78 76  BUN 24* CREATININE 1.05* 0.77 0.74 0.63 0.68  CALCIUM 8.7* 8.0* 8.1* 8.4* 8.4*  MG  --  2.0 2.0  --  2.0  PHOS  --  3.3  --   --   --    Liver Function Tests:  Recent Labs Lab 10/06/15 1827 10/07/15 0655  AST 25 27  ALT 11* 15  ALKPHOS 124 97  BILITOT  0.9 0.7  PROT 6.8 4.9*  ALBUMIN 3.2* 2.1*   No results for input(s): LIPASE, AMYLASE in the last 168 hours. No results for input(s): AMMONIA in the last 168 hours. CBC:  Recent Labs Lab 10/06/15 1827 10/07/15 0655 10/08/15 0612 10/09/15 0012 10/10/15 0514  WBC 13.9* 10.1 7.1 5.4 5.6  HGB 15.3* 13.4 13.5 12.6 13.9  HCT 46.6* 41.6 42.1 38.9 45.0  MCV 97.9 99.3 98.4 99.7 98.0  PLT 115* 104* 85* 96* 108*   Cardiac Enzymes:  Recent Labs Lab 10/07/15 0224 10/07/15 0655 10/07/15 1322  TROPONINI 0.04* 0.03 0.03   BNP (last 3 results) No results for input(s): BNP in the last 8760 hours.  ProBNP (last 3 results) No results for input(s): PROBNP in the last 8760 hours.  CBG:  Recent Labs Lab 10/10/15 0720 10/10/15 0943 10/10/15 1803 10/10/15 2119 10/11/15 0742  GLUCAP 69 108* 131* 115* 95    Recent Results (from the past 240 hour(s))  Blood culture (routine x 2)     Status: Abnormal   Collection Time: 10/06/15  6:30 PM  Result Value Ref Range Status   Specimen Description BLOOD RIGHT ANTECUBITAL  Final   Special Requests BOTTLES DRAWN AEROBIC AND ANAEROBIC 5CC  Final   Culture  Setup Time   Final    GRAM NEGATIVE RODS IN BOTH AEROBIC AND ANAEROBIC BOTTLES CRITICAL RESULT CALLED TO, READ BACK BY AND VERIFIED WITH: R HILL,RN AT 0901 10/07/15 BY L BENFIELD    Culture (A)  Final    ESCHERICHIA COLI ENTEROCOCCUS SPECIES CRITICAL RESULT CALLED TO, READ BACK BY AND VERIFIED WITH: A. BRAKE,RN AT 1654 ON [id number] BY Lucienne Capers Performed at [facility]    Report Status 10/10/2015 FINAL  Final   Organism ID, Bacteria ESCHERICHIA COLI  Final   Organism ID, Bacteria ENTEROCOCCUS SPECIES  Final      Susceptibility   Escherichia coli - MIC*    AMPICILLIN >=32 RESISTANT Resistant     CEFAZOLIN <=4 SENSITIVE Sensitive     CEFEPIME <=1 SENSITIVE Sensitive     CEFTAZIDIME <=1 SENSITIVE Sensitive     CEFTRIAXONE <=1 SENSITIVE Sensitive     CIPROFLOXACIN <=0.25  SENSITIVE Sensitive     GENTAMICIN <=1 SENSITIVE Sensitive     IMIPENEM <=0.25 SENSITIVE Sensitive     TRIMETH/SULFA <=20 SENSITIVE Sensitive     AMPICILLIN/SULBACTAM 8 SENSITIVE Sensitive     PIP/TAZO <=4 SENSITIVE Sensitive     * ESCHERICHIA COLI   Enterococcus species - MIC*    AMPICILLIN <=2 SENSITIVE Sensitive     VANCOMYCIN 1 SENSITIVE Sensitive     GENTAMICIN SYNERGY SENSITIVE Sensitive     * ENTEROCOCCUS SPECIES  Blood culture (routine x 2)     Status: Abnormal   Collection Time: 10/06/15  7:25 PM  Result Value Ref Range Status   Specimen Description BLOOD LEFT ANTECUBITAL  Final   Special Requests BOTTLES DRAWN AEROBIC AND ANAEROBIC 3CC  Final   Culture  Setup  Time   Final    GRAM NEGATIVE RODS AEROBIC BOTTLE ONLY CRITICAL RESULT CALLED TO, READ BACK BY AND VERIFIED WITH: Anthoney Harada [id number] ON [id number] BY Lucienne Capers    Culture (A)  Final    ESCHERICHIA COLI SUSCEPTIBILITIES PERFORMED ON PREVIOUS CULTURE WITHIN THE LAST 5 DAYS. Performed at [facility]    Report Status 10/10/2015 FINAL  Final  MRSA PCR Screening     Status: None   Collection Time: 10/07/15  1:40 AM  Result Value Ref Range Status   MRSA by PCR NEGATIVE NEGATIVE Final    Comment:        The GeneXpert MRSA Assay (FDA approved for NASAL specimens only), is one component of a comprehensive MRSA colonization surveillance program. It is not intended to diagnose MRSA infection nor to guide or monitor treatment for MRSA infections.   Urine culture     Status: None   Collection Time: 10/08/15  2:18 AM  Result Value Ref Range Status   Specimen Description URINE, CLEAN CATCH  Final   Special Requests NONE  Final   Culture NO GROWTH 1 DAY  Final   Report Status 10/09/2015 FINAL  Final  Culture, blood (routine x 2)     Status: None (Preliminary result)   Collection Time: 10/09/15 12:05 AM  Result Value Ref Range Status   Specimen Description BLOOD RIGHT HAND  Final   Special Requests BOTTLES  DRAWN AEROBIC AND ANAEROBIC 5CC  Final   Culture NO GROWTH 1 DAY  Final   Report Status PENDING  Incomplete  Culture, blood (routine x 2)     Status: None (Preliminary result)   Collection Time: 10/09/15 12:10 AM  Result Value Ref Range Status   Specimen Description BLOOD RIGHT ANTECUBITAL  Final   Special Requests BOTTLES DRAWN AEROBIC AND ANAEROBIC 5CC  Final   Culture NO GROWTH 1 DAY  Final   Report Status PENDING  Incomplete     Studies: No results found.  Scheduled Meds: . aspirin  81 mg Oral Daily  . cefTRIAXone (ROCEPHIN)  IV  2 g Intravenous Q24H  . donepezil  5 mg Oral QHS  . enoxaparin (LOVENOX) injection  30 mg Subcutaneous Q24H  . feeding supplement (ENSURE ENLIVE)  237 mL Oral Q1500  . furosemide  20 mg Oral Daily  . guaiFENesin  600 mg Oral BID  . insulin aspart  0-5 Units Subcutaneous QHS  . insulin aspart  0-9 Units Subcutaneous TID WC  . isosorbide mononitrate  10 mg Oral BID  . nebivolol  2.5 mg Oral Daily  . pantoprazole  40 mg Oral Daily  . pravastatin  20 mg Oral Daily  . sodium chloride flush  3 mL Intravenous Q12H  . vancomycin  750 mg Intravenous Once  . vancomycin  750 mg Intravenous Q24H   Continuous Infusions:     Time spent: 35 minutes    [facility] MD   [facility] Pager [phone]. If 7PM-7AM, please contact night-coverage at [email or web address], password [facility] 10/11/2015, 8:40 AM  LOS: 5 days

## 2015-10-11 NOTE — Progress Notes (Signed)
Physical Therapy Treatment Patient Details Name: Donna Dalton MRN: [id number] DOB: 1925-08-09 Today's Date: 10/11/2015    History of Present Illness 82 y.o. female has a past medical history of Coronary artery disease; Hypertension; GERD (gastroesophageal reflux disease); COPD (chronic obstructive pulmonary disease) (HCC); Stroke [facility](HCC); and Dementia; admitted with fever and confusion.    PT Comments    Pt ambulated 120' with RW and min-guard A with decreased safety with turns and when distracted. Pt does not remember to call for help or use RW when getting up, was in bathroom upon PT arrival and NT reports she got up and went there one her own without RW, chair alarm in place. PT will continue to follow.   Follow Up Recommendations  SNF;Supervision/Assistance - 24 hour     Equipment Recommendations  Rolling walker with 5" wheels;Other (comment) (if she does not have one)    Recommendations for Other Services       Precautions / Restrictions Precautions Precautions: Fall Precaution Comments: monitor O2 Restrictions Weight Bearing Restrictions: No    Mobility  Bed Mobility               General bed mobility comments: pt received out of bed  Transfers Overall transfer level: Needs assistance Equipment used: Rolling walker (2 wheeled) Transfers: Sit to/from Stand Sit to Stand: Min assist;Min guard         General transfer comment: min A to stand from toilet but only required min-guard to rise from recliner using arms of chair.   Ambulation/Gait Ambulation/Gait assistance: Min assist Ambulation Distance (Feet): 120 Feet Assistive device: Rolling walker (2 wheeled) Gait Pattern/deviations: Step-through pattern;Trunk flexed;Drifts right/left Gait velocity: decreased Gait velocity interpretation: Below normal speed for age/gender General Gait Details: Pt O2 sats 89% on RA while in bathroom, 93% after ambulation on RA. cues for safety with turning within RW.     Stairs            Wheelchair Mobility    Modified Rankin (Stroke Patients Only)       Balance Overall balance assessment: Needs assistance Sitting-balance support: No upper extremity supported;Feet supported Sitting balance-Leahy Scale: Good     Standing balance support: No upper extremity supported Standing balance-Leahy Scale: Fair Standing balance comment: can maintain static balance without UE support, UE support for safety with dynamic activity as pt with increased reaction time and decreased awareness of safety                    Cognition Arousal/Alertness: Awake/alert Behavior During Therapy: WFL for tasks assessed/performed Overall Cognitive Status: No family/caregiver present to determine baseline cognitive functioning Area of Impairment: Orientation;Memory Orientation Level: Disoriented to;Place;Time;Situation   Memory: Decreased short-term memory         General Comments: pt thinks she is in her apt complex    Exercises General Exercises - Lower Extremity Ankle Circles/Pumps: AROM;Both;20 reps;Seated Long Arc Quad: AROM;Both;15 reps;Seated Hip Flexion/Marching: AROM;Both;15 reps;Seated Toe Raises: AROM;Both;15 reps;Seated Heel Raises: AROM;Both;15 reps;Seated    General Comments General comments (skin integrity, edema, etc.): pt can verbalize safety precautions but does not remember to keep them      Pertinent Vitals/Pain Pain Assessment: No/denies pain    Home Living                      Prior Function            PT Goals (current goals can now be found in the care plan section) Acute  Rehab PT Goals Patient Stated Goal: return home PT Goal Formulation: With patient Time For Goal Achievement: 10/21/15 Potential to Achieve Goals: Good Progress towards PT goals: Progressing toward goals    Frequency  Min 2X/week    PT Plan Current plan remains appropriate;Equipment recommendations need to be updated     Co-evaluation             End of Session Equipment Utilized During Treatment: Gait belt Activity Tolerance: Patient tolerated treatment well Patient left: in chair;with call bell/phone within reach;with chair alarm set     Time: 1355-1413 PT Time Calculation (min) (ACUTE ONLY): 18 min  Charges:  $Gait Training: 8-22 mins                    G Codes:     Lyanne Co, PT  Acute Rehab Services  [phone]  Lyanne Co 10/11/2015, 3:26 PM

## 2015-10-11 NOTE — Progress Notes (Addendum)
INFECTIOUS DISEASE PROGRESS NOTE  ID: Donna Dalton is a 82 y.o. female with  Active Problems:   Arteriosclerosis of coronary artery   Chronic obstructive pulmonary disease (HCC)   Essential (primary) hypertension   Borderline diabetes mellitus   Automatic implantable cardioverter-defibrillator in situ   Sepsis (HCC)   Dementia with behavioral disturbance   Acute encephalopathy   Protein-calorie malnutrition, severe   Bacteremia due to Escherichia coli   Enterococcal bacteremia   Acute UTI (urinary tract infection)   Chronic respiratory failure with hypoxia (HCC)   On home oxygen therapy   Pressure ulcer  Subjective: Pleasantly confused  Abtx:  Anti-infectives    Start     Dose/Rate Route Frequency Ordered Stop   10/10/15 2000  vancomycin (VANCOCIN) IVPB 750 mg/150 ml premix     750 mg 150 mL/hr over 60 Minutes Intravenous Every 24 hours 10/09/15 1733     10/10/15 1700  cefTRIAXone (ROCEPHIN) 2 g in dextrose 5 % 50 mL IVPB     2 g 100 mL/hr over 30 Minutes Intravenous Every 24 hours 10/10/15 1546     10/09/15 1830  vancomycin (VANCOCIN) IVPB 750 mg/150 ml premix  Status:  Discontinued     750 mg 150 mL/hr over 60 Minutes Intravenous  Once 10/09/15 1733 10/11/15 1432   10/07/15 2200  vancomycin (VANCOCIN) 500 mg in sodium chloride 0.9 % 100 mL IVPB  Status:  Discontinued     500 mg 100 mL/hr over 60 Minutes Intravenous Every 24 hours 10/06/15 2335 10/07/15 0922   10/07/15 2000  ceFEPIme (MAXIPIME) 1 g in dextrose 5 % 50 mL IVPB  Status:  Discontinued     1 g 100 mL/hr over 30 Minutes Intravenous Every 24 hours 10/06/15 2007 10/06/15 2335   10/07/15 1800  ceFEPIme (MAXIPIME) 2 g in dextrose 5 % 50 mL IVPB  Status:  Discontinued     2 g 100 mL/hr over 30 Minutes Intravenous Every 24 hours 10/06/15 2335 10/10/15 1546   10/06/15 1930  vancomycin (VANCOCIN) IVPB 1000 mg/200 mL premix     1,000 mg 200 mL/hr over 60 Minutes Intravenous  Once 10/06/15 1918 10/06/15 2118   10/06/15 1930  ceFEPIme (MAXIPIME) 2 g in dextrose 5 % 50 mL IVPB     2 g 100 mL/hr over 30 Minutes Intravenous  Once 10/06/15 1921 10/06/15 2017   10/06/15 1929  ceFEPIme (MAXIPIME) 2 g injection    Comments:  Tommi Rumpsoberts, Megan   : cabinet override      10/06/15 1929 10/07/15 0744      Medications:  Scheduled: . aspirin  81 mg Oral Daily  . cefTRIAXone (ROCEPHIN)  IV  2 g Intravenous Q24H  . donepezil  5 mg Oral QHS  . [START ON 10/12/2015] enoxaparin (LOVENOX) injection  40 mg Subcutaneous Q24H  . feeding supplement (ENSURE ENLIVE)  237 mL Oral Q1500  . furosemide  20 mg Oral Daily  . guaiFENesin  600 mg Oral BID  . insulin aspart  0-5 Units Subcutaneous QHS  . insulin aspart  0-9 Units Subcutaneous TID WC  . isosorbide mononitrate  10 mg Oral BID  . nebivolol  2.5 mg Oral Daily  . pantoprazole  40 mg Oral Daily  . pravastatin  20 mg Oral Daily  . sodium chloride flush  3 mL Intravenous Q12H  . vancomycin  750 mg Intravenous Q24H    Objective: Vital signs in last 24 hours: Temp:  [97.8 F (36.6 C)-98.4 F (36.9 C)]  98.2 F (36.8 C) (04/17 0740) Pulse Rate:  [69-70] 70 (04/17 0740) Resp:  [18-20] 18 (04/17 0740) BP: (107-159)/(71-94) 159/71 mmHg (04/17 0740) SpO2:  [94 %-98 %] 96 % (04/17 0740) Weight:  [54.1 kg (119 lb 4.3 oz)] 54.1 kg (119 lb 4.3 oz) (04/16 2116)   General appearance: alert, cooperative, no distress and pleasantly confused.  Resp: clear to auscultation bilaterally Chest wall: no tenderness, L sided AICD is non-tender, no fluctuance, no increase in warmth.  Cardio: regular rate and rhythm GI: normal findings: bowel sounds normal and soft, non-tender Extremities: edema none  Lab Results  Recent Labs  10/09/15 0012 10/10/15 0514 10/11/15 0601  WBC 5.4 5.6  --   HGB 12.6 13.9  --   HCT 38.9 45.0  --   NA 140 142 142  K 3.8 3.6 4.2  CL 106 104 104  CO2 BUN CREATININE 0.74 0.63 0.68   Liver Panel No results for input(s):  PROT, ALBUMIN, AST, ALT, ALKPHOS, BILITOT, BILIDIR, IBILI in the last 72 hours. Sedimentation Rate No results for input(s): ESRSEDRATE in the last 72 hours. C-Reactive Protein No results for input(s): CRP in the last 72 hours.  Microbiology: Recent Results (from the past 240 hour(s))  Blood culture (routine x 2)     Status: Abnormal   Collection Time: 10/06/15  6:30 PM  Result Value Ref Range Status   Specimen Description BLOOD RIGHT ANTECUBITAL  Final   Special Requests BOTTLES DRAWN AEROBIC AND ANAEROBIC 5CC  Final   Culture  Setup Time   Final    GRAM NEGATIVE RODS IN BOTH AEROBIC AND ANAEROBIC BOTTLES CRITICAL RESULT CALLED TO, READ BACK BY AND VERIFIED WITH: R HILL,RN AT 0901 10/07/15 BY L BENFIELD    Culture (A)  Final    ESCHERICHIA COLI ENTEROCOCCUS SPECIES CRITICAL RESULT CALLED TO, READ BACK BY AND VERIFIED WITH: A. BRAKE,RN AT 1654 ON [id number] BY Lucienne Capers Performed at [facility]    Report Status 10/10/2015 FINAL  Final   Organism ID, Bacteria ESCHERICHIA COLI  Final   Organism ID, Bacteria ENTEROCOCCUS SPECIES  Final      Susceptibility   Escherichia coli - MIC*    AMPICILLIN >=32 RESISTANT Resistant     CEFAZOLIN <=4 SENSITIVE Sensitive     CEFEPIME <=1 SENSITIVE Sensitive     CEFTAZIDIME <=1 SENSITIVE Sensitive     CEFTRIAXONE <=1 SENSITIVE Sensitive     CIPROFLOXACIN <=0.25 SENSITIVE Sensitive     GENTAMICIN <=1 SENSITIVE Sensitive     IMIPENEM <=0.25 SENSITIVE Sensitive     TRIMETH/SULFA <=20 SENSITIVE Sensitive     AMPICILLIN/SULBACTAM 8 SENSITIVE Sensitive     PIP/TAZO <=4 SENSITIVE Sensitive     * ESCHERICHIA COLI   Enterococcus species - MIC*    AMPICILLIN <=2 SENSITIVE Sensitive     VANCOMYCIN 1 SENSITIVE Sensitive     GENTAMICIN SYNERGY SENSITIVE Sensitive     * ENTEROCOCCUS SPECIES  Blood culture (routine x 2)     Status: Abnormal   Collection Time: 10/06/15  7:25 PM  Result Value Ref Range Status   Specimen Description BLOOD LEFT  ANTECUBITAL  Final   Special Requests BOTTLES DRAWN AEROBIC AND ANAEROBIC 3CC  Final   Culture  Setup Time   Final    GRAM NEGATIVE RODS AEROBIC BOTTLE ONLY CRITICAL RESULT CALLED TO, READ BACK BY AND VERIFIED WITH: Anthoney Harada [id number] ON [id number] BY Lucienne Capers    Culture (  A)  Final    ESCHERICHIA COLI SUSCEPTIBILITIES PERFORMED ON PREVIOUS CULTURE WITHIN THE LAST 5 DAYS. Performed at [facility]    Report Status 10/10/2015 FINAL  Final  MRSA PCR Screening     Status: None   Collection Time: 10/07/15  1:40 AM  Result Value Ref Range Status   MRSA by PCR NEGATIVE NEGATIVE Final    Comment:        The GeneXpert MRSA Assay (FDA approved for NASAL specimens only), is one component of a comprehensive MRSA colonization surveillance program. It is not intended to diagnose MRSA infection nor to guide or monitor treatment for MRSA infections.   Urine culture     Status: None   Collection Time: 10/08/15  2:18 AM  Result Value Ref Range Status   Specimen Description URINE, CLEAN CATCH  Final   Special Requests NONE  Final   Culture NO GROWTH 1 DAY  Final   Report Status 10/09/2015 FINAL  Final  Culture, blood (routine x 2)     Status: None (Preliminary result)   Collection Time: 10/09/15 12:05 AM  Result Value Ref Range Status   Specimen Description BLOOD RIGHT HAND  Final   Special Requests BOTTLES DRAWN AEROBIC AND ANAEROBIC 5CC  Final   Culture NO GROWTH 2 DAYS  Final   Report Status PENDING  Incomplete  Culture, blood (routine x 2)     Status: None (Preliminary result)   Collection Time: 10/09/15 12:10 AM  Result Value Ref Range Status   Specimen Description BLOOD RIGHT ANTECUBITAL  Final   Special Requests BOTTLES DRAWN AEROBIC AND ANAEROBIC 5CC  Final   Culture NO GROWTH 2 DAYS  Final   Report Status PENDING  Incomplete    Studies/Results: No results found.   Assessment/Plan: E coli, Enterococcal bacteremia UTI AICD  Total days of antibiotics: 6  cefepime, 3 vanco  TTE did not show vegetation.  Not clear that her AICD was commented on.  Due to her PEN allergy would keep her on her current anbx for 4 weeks. She is unclear where she lives, states her son makes her decisions.   Available as needed.          Johny Sax Infectious Diseases (pager) [phone] [email or web address] 10/11/2015, 4:51 PM  LOS: 5 days

## 2015-10-12 LAB — COMPREHENSIVE METABOLIC PANEL
ALBUMIN: 2.3 g/dL — AB (ref 3.5–5.0)
ALK PHOS: 92 U/L (ref 38–126)
ALT: 17 U/L (ref 14–54)
AST: 28 U/L (ref 15–41)
Anion gap: 10 (ref 5–15)
BUN: 7 mg/dL (ref 6–20)
CALCIUM: 8.4 mg/dL — AB (ref 8.9–10.3)
CO2: 33 mmol/L — AB (ref 22–32)
CREATININE: 0.6 mg/dL (ref 0.44–1.00)
Chloride: 98 mmol/L — ABNORMAL LOW (ref 101–111)
GFR calc Af Amer: >60 mL/min (ref >60)
GFR calc non Af Amer: >60 mL/min (ref >60)
GLUCOSE: 85 mg/dL (ref 65–99)
Potassium: 3.8 mmol/L (ref 3.5–5.1)
SODIUM: 141 mmol/L (ref 135–145)
TOTAL PROTEIN: 5.4 g/dL — AB (ref 6.5–8.1)
Total Bilirubin: 0.7 mg/dL (ref 0.3–1.2)

## 2015-10-12 LAB — GLUCOSE, CAPILLARY
GLUCOSE-CAPILLARY: 116 mg/dL — AB (ref 65–99)
GLUCOSE-CAPILLARY: 93 mg/dL (ref 65–99)
Glucose-Capillary: 138 mg/dL — ABNORMAL HIGH (ref 65–99)
Glucose-Capillary: 162 mg/dL — ABNORMAL HIGH (ref 65–99)
Glucose-Capillary: 85 mg/dL (ref 65–99)

## 2015-10-12 LAB — CBC
HCT: 39.8 % (ref 36.0–46.0)
Hemoglobin: 12.8 g/dL (ref 12.0–15.0)
MCH: 31 pg (ref 26.0–34.0)
MCHC: 32.2 g/dL (ref 30.0–36.0)
MCV: 96.4 fL (ref 78.0–100.0)
Platelets: 113 10*3/uL — ABNORMAL LOW (ref 150–400)
RBC: 4.13 MIL/uL (ref 3.87–5.11)
RDW: 14.6 % (ref 11.5–15.5)
WBC: 4.9 10*3/uL (ref 4.0–10.5)

## 2015-10-12 MED ORDER — FUROSEMIDE 20 MG PO TABS
40.0000 mg | ORAL_TABLET | Freq: Every day | ORAL | Status: AC
Start: 2015-10-12 — End: ?

## 2015-10-12 MED ORDER — HYDROCODONE-ACETAMINOPHEN 5-325 MG PO TABS: 1.0000 | ORAL_TABLET | Freq: Four times a day (QID) | ORAL | Status: AC | PRN

## 2015-10-12 MED ORDER — DEXTROSE 5 % IV SOLN: 2.0000 g | INTRAVENOUS | Status: AC

## 2015-10-12 MED ORDER — FUROSEMIDE 20 MG PO TABS: 20.0000 mg | ORAL_TABLET | Freq: Every day | ORAL | Status: DC

## 2015-10-12 MED ORDER — VANCOMYCIN HCL IN DEXTROSE 750-5 MG/150ML-% IV SOLN: 750.0000 mg | INTRAVENOUS | Status: AC

## 2015-10-12 MED ORDER — GUAIFENESIN ER 600 MG PO TB12: 600.0000 mg | ORAL_TABLET | Freq: Two times a day (BID) | ORAL | Status: AC

## 2015-10-12 NOTE — Care Management Important Message (Signed)
Important Message  Patient Details  Name: Donna Dalton MRN: [id number] Date of Birth: [birth year]   Medicare Important Message Given:  Yes    [name] P [name] 10/12/2015, 12:21 PM

## 2015-10-12 NOTE — Progress Notes (Signed)
RN was notified by IV team that patient's PICC line will be placed in the morning of 10/13/15. Patient is to be discharged before PICC placement.  [facility] Pharmacist, [facility]  Nurse Supervisor that patient will not be discharged until PICC placement.

## 2015-10-12 NOTE — Discharge Summary (Signed)
Physician Discharge Summary  Donna Dalton MRN: [id number] DOB/AGE: 1925/10/04 82 y.o.  PCP: Donna Schuller, MD   Admit date: 10/06/2015 Discharge date: 10/12/2015  Discharge Diagnoses:     Active Problems:   Arteriosclerosis of coronary artery   Chronic obstructive pulmonary disease (HCC)   Essential (primary) hypertension   Borderline diabetes mellitus   Automatic implantable cardioverter-defibrillator in situ   Sepsis ([facility])   Dementia with behavioral disturbance   Acute encephalopathy   Protein-calorie malnutrition, severe   Bacteremia due to Escherichia coli   Enterococcal bacteremia   Acute UTI (urinary tract infection)   Chronic respiratory failure with hypoxia (HCC)   On home oxygen therapy   Pressure ulcer    Follow-up recommendations Follow-up with PCP in 3-5 days , including all  additional recommended appointments as below Follow-up CBC, CMP in 3-5 days Continue abx until 5/13      Medication List    TAKE these medications        amLODipine 5 MG tablet  Commonly known as:  NORVASC  Take 5 mg by mouth.     aspirin 81 MG chewable tablet  Chew 81 mg by mouth daily.     cefTRIAXone 2 g in dextrose 5 % 50 mL  Inject 2 g into the vein daily.     donepezil 5 MG tablet  Commonly known as:  ARICEPT  TAKE 1 TABLET (5 MG TOTAL) BY MOUTH NIGHTLY.     furosemide 40 MG tablet  Commonly known as:  LASIX  Take 1 tablet (40 mg total) by mouth daily.     guaiFENesin 600 MG 12 hr tablet  Commonly known as:  MUCINEX  Take 1 tablet (600 mg total) by mouth 2 (two) times daily.     HYDROcodone-acetaminophen 5-325 MG tablet  Commonly known as:  NORCO/VICODIN  Take 1 tablet by mouth every 6 (six) hours as needed for moderate pain.     hydrOXYzine 10 MG tablet  Commonly known as:  ATARAX/VISTARIL  Take 10 mg by mouth daily as needed for itching.     isosorbide mononitrate 10 MG tablet  Commonly known as:  ISMO,MONOKET  TAKE 1 TABLET (10 MG TOTAL) BY MOUTH  TWO (2) TIMES A DAY.     nebivolol 2.5 MG tablet  Commonly known as:  BYSTOLIC  Take 2.5 mg by mouth daily.     nitroGLYCERIN 0.4 MG SL tablet  Commonly known as:  NITROSTAT  Place 0.4 mg under the tongue every 5 (five) minutes as needed for chest pain.     omeprazole 20 MG capsule  Commonly known as:  PRILOSEC  Take 20 mg by mouth daily.     pravastatin 20 MG tablet  Commonly known as:  PRAVACHOL  TAKE 1 TABLET (20 MG TOTAL) BY MOUTH EVERY EVENING.     Vancomycin 750 MG/150ML Soln  Commonly known as:  VANCOCIN  Inject 150 mLs (750 mg total) into the vein daily.     VITAMIN D PO  Take 50 mg by mouth once.         Discharge Condition:     Discharge Instructions Get Medicines reviewed and adjusted: Please take all your medications with you for your next visit with your Primary MD  Please request your Primary MD to go over all hospital tests and procedure/radiological results at the follow up, please ask your Primary MD to get all Hospital records sent to his/her office.  If you experience worsening of your admission symptoms, develop shortness of  breath, life threatening emergency, suicidal or homicidal thoughts you must seek medical attention immediately by calling 911 or calling your MD immediately if symptoms less severe.  You must read complete instructions/literature along with all the possible adverse reactions/side effects for all the Medicines you take and that have been prescribed to you. Take any new Medicines after you have completely understood and accpet all the possible adverse reactions/side effects.   Do not drive when taking Pain medications.   Do not take more than prescribed Pain, Sleep and Anxiety Medications  Special Instructions: If you have smoked or chewed Tobacco in the last 2 yrs please stop smoking, stop any regular Alcohol and or any Recreational drug use.  Wear Seat belts while driving.  Please note  You were cared for by a  hospitalist during your hospital stay. Once you are discharged, your primary care physician will handle any further medical issues. Please note that NO REFILLS for any discharge medications will be authorized once you are discharged, as it is imperative that you return to your primary care physician (or establish a relationship with a primary care physician if you do not have one) for your aftercare needs so that they can reassess your need for medications and monitor your lab values.      Discharge Instructions    Diet - low sodium heart healthy    Complete by:  As directed      Diet - low sodium heart healthy    Complete by:  As directed      Increase activity slowly    Complete by:  As directed      Increase activity slowly    Complete by:  As directed            Allergies  Allergen Reactions  . Penicillins Rash    Hot feeling all over Has patient had a PCN reaction causing immediate rash, facial/tongue/throat swelling, SOB or lightheadedness with hypotension:YES Has patient had a PCN reaction causing severe rash involving mucus membranes or skin necrosis: NO Has patient had a PCN reaction that required hospitalization NO Has patient had a PCN reaction occurring within the last 10 years: NO If all of the above answers are "NO", then may proceed with Cephalosporin use.        Disposition: 01-Home or Self Care   Consults: Infectious disease    Significant Diagnostic Studies:  Dg Chest 2 View  10/06/2015  CLINICAL DATA:  Shortness of breath for 1 day.  Fever. EXAM: CHEST  2 VIEW COMPARISON:  August 12, 2015 and Nov 22, 2013 FINDINGS: Lungs are hyperexpanded. There is scarring in the right upper lobe region, stable. There is no edema or consolidation. Heart is upper normal in size with pulmonary vascularity within normal limits. Pacemaker leads are attached to the right atrium, right ventricle, and left ventricle. Bones are osteoporotic. No adenopathy evident. IMPRESSION:  Scarring right upper lobe. Lungs hyperexpanded. No edema or consolidation. Stable cardiac silhouette. Electronically Signed   By: Lowella Grip III M.D.   On: 10/06/2015 19:07    2-D echo LV EF: 35% - 40%  ------------------------------------------------------------------- Indications: Bacteremia 790.7.  ------------------------------------------------------------------- History: PMH: Dementia. Pedal edema. Coronary artery disease. Chronic obstructive pulmonary disease. Risk factors: Diabetes mellitus.  ------------------------------------------------------------------- Study Conclusions  - Left ventricle: Abnormal septal motion inferior wall hypokinesis  The cavity size was mildly dilated. Wall thickness was normal.  Systolic function was moderately reduced. The estimated ejection  fraction was in the range of 35% to  40%. - Mitral valve: There was moderate regurgitation. - Left atrium: The atrium was mildly dilated. - Right atrium: The atrium was mildly dilated. - Atrial septum: No defect or patent foramen ovale was identified.    Filed Weights   10/08/15 2006 10/09/15 2009 10/10/15 2116  Weight: 51.9 kg (114 lb 6.7 oz) 51.3 kg (113 lb 1.5 oz) 54.1 kg (119 lb 4.3 oz)     Microbiology: Recent Results (from the past 240 hour(s))  Blood culture (routine x 2)     Status: Abnormal   Collection Time: 10/06/15  6:30 PM  Result Value Ref Range Status   Specimen Description BLOOD RIGHT ANTECUBITAL  Final   Special Requests BOTTLES DRAWN AEROBIC AND ANAEROBIC 5CC  Final   Culture  Setup Time   Final    GRAM NEGATIVE RODS IN BOTH AEROBIC AND ANAEROBIC BOTTLES CRITICAL RESULT CALLED TO, READ BACK BY AND VERIFIED WITH: R HILL,RN AT 0901 10/07/15 BY L BENFIELD    Culture (A)  Final    ESCHERICHIA COLI ENTEROCOCCUS SPECIES CRITICAL RESULT CALLED TO, READ BACK BY AND VERIFIED WITH: A. BRAKE,RN AT 9476 ON [id number] BY Rhea Bleacher Performed at [facility]    Report Status 10/10/2015 FINAL  Final   Organism ID, Bacteria ESCHERICHIA COLI  Final   Organism ID, Bacteria ENTEROCOCCUS SPECIES  Final      Susceptibility   Escherichia coli - MIC*    AMPICILLIN >=32 RESISTANT Resistant     CEFAZOLIN <=4 SENSITIVE Sensitive     CEFEPIME <=1 SENSITIVE Sensitive     CEFTAZIDIME <=1 SENSITIVE Sensitive     CEFTRIAXONE <=1 SENSITIVE Sensitive     CIPROFLOXACIN <=0.25 SENSITIVE Sensitive     GENTAMICIN <=1 SENSITIVE Sensitive     IMIPENEM <=0.25 SENSITIVE Sensitive     TRIMETH/SULFA <=20 SENSITIVE Sensitive     AMPICILLIN/SULBACTAM 8 SENSITIVE Sensitive     PIP/TAZO <=4 SENSITIVE Sensitive     * ESCHERICHIA COLI   Enterococcus species - MIC*    AMPICILLIN <=2 SENSITIVE Sensitive     VANCOMYCIN 1 SENSITIVE Sensitive     GENTAMICIN SYNERGY SENSITIVE Sensitive     * ENTEROCOCCUS SPECIES  Blood culture (routine x 2)     Status: Abnormal   Collection Time: 10/06/15  7:25 PM  Result Value Ref Range Status   Specimen Description BLOOD LEFT ANTECUBITAL  Final   Special Requests BOTTLES DRAWN AEROBIC AND ANAEROBIC 3CC  Final   Culture  Setup Time   Final    GRAM NEGATIVE RODS AEROBIC BOTTLE ONLY CRITICAL RESULT CALLED TO, READ BACK BY AND VERIFIED WITH: Lambert Keto [id number] ON [id number] BY Rhea Bleacher    Culture (A)  Final    ESCHERICHIA COLI SUSCEPTIBILITIES PERFORMED ON PREVIOUS CULTURE WITHIN THE LAST 5 DAYS. Performed at [facility]    Report Status 10/10/2015 FINAL  Final  MRSA PCR Screening     Status: None   Collection Time: 10/07/15  1:40 AM  Result Value Ref Range Status   MRSA by PCR NEGATIVE NEGATIVE Final    Comment:        The GeneXpert MRSA Assay (FDA approved for NASAL specimens only), is one component of a comprehensive MRSA colonization surveillance program. It is not intended to diagnose MRSA infection nor to guide or monitor treatment for MRSA infections.   Urine culture     Status: None   Collection  Time: 10/08/15  2:18 AM  Result Value Ref Range Status  Specimen Description URINE, CLEAN CATCH  Final   Special Requests NONE  Final   Culture NO GROWTH 1 DAY  Final   Report Status 10/09/2015 FINAL  Final  Culture, blood (routine x 2)     Status: None (Preliminary result)   Collection Time: 10/09/15 12:05 AM  Result Value Ref Range Status   Specimen Description BLOOD RIGHT HAND  Final   Special Requests BOTTLES DRAWN AEROBIC AND ANAEROBIC 5CC  Final   Culture NO GROWTH 2 DAYS  Final   Report Status PENDING  Incomplete  Culture, blood (routine x 2)     Status: None (Preliminary result)   Collection Time: 10/09/15 12:10 AM  Result Value Ref Range Status   Specimen Description BLOOD RIGHT ANTECUBITAL  Final   Special Requests BOTTLES DRAWN AEROBIC AND ANAEROBIC 5CC  Final   Culture NO GROWTH 2 DAYS  Final   Report Status PENDING  Incomplete       Blood Culture    Component Value Date/Time   SDES BLOOD RIGHT ANTECUBITAL 10/09/2015 0010   SPECREQUEST BOTTLES DRAWN AEROBIC AND ANAEROBIC 5CC 10/09/2015 0010   CULT NO GROWTH 2 DAYS 10/09/2015 0010   REPTSTATUS PENDING 10/09/2015 0010      Labs: Results for orders placed or performed during the hospital encounter of 10/06/15 (from the past 48 hour(s))  Glucose, capillary     Status: Abnormal   Collection Time: 10/10/15  6:03 PM  Result Value Ref Range   Glucose-Capillary 131 (H) 65 - 99 mg/dL  Glucose, capillary     Status: Abnormal   Collection Time: 10/10/15  9:19 PM  Result Value Ref Range   Glucose-Capillary 115 (H) 65 - 99 mg/dL  Basic metabolic panel     Status: Abnormal   Collection Time: 10/11/15  6:01 AM  Result Value Ref Range   Sodium 142 135 - 145 mmol/L   Potassium 4.2 3.5 - 5.1 mmol/L   Chloride 104 101 - 111 mmol/L   CO2 29 22 - 32 mmol/L   Glucose, Bld 76 65 - 99 mg/dL   BUN 9 6 - 20 mg/dL   Creatinine, Ser 0.68 0.44 - 1.00 mg/dL   Calcium 8.4 (L) 8.9 - 10.3 mg/dL   GFR calc non Af Amer >60 >60 mL/min    GFR calc Af Amer >60 >60 mL/min    Comment: (NOTE) The eGFR has been calculated using the CKD EPI equation. This calculation has not been validated in all clinical situations. eGFR's persistently <60 mL/min signify possible Chronic Kidney Disease.    Anion gap 9 5 - 15  Magnesium     Status: None   Collection Time: 10/11/15  6:01 AM  Result Value Ref Range   Magnesium 2.0 1.7 - 2.4 mg/dL  Glucose, capillary     Status: None   Collection Time: 10/11/15  7:42 AM  Result Value Ref Range   Glucose-Capillary 95 65 - 99 mg/dL  Glucose, capillary     Status: None   Collection Time: 10/11/15 12:12 PM  Result Value Ref Range   Glucose-Capillary 67 65 - 99 mg/dL  Glucose, capillary     Status: Abnormal   Collection Time: 10/11/15  5:03 PM  Result Value Ref Range   Glucose-Capillary 116 (H) 65 - 99 mg/dL  Glucose, capillary     Status: None   Collection Time: 10/11/15  9:27 PM  Result Value Ref Range   Glucose-Capillary 83 65 - 99 mg/dL  Comprehensive metabolic panel  Status: Abnormal   Collection Time: 10/12/15  6:26 AM  Result Value Ref Range   Sodium 141 135 - 145 mmol/L   Potassium 3.8 3.5 - 5.1 mmol/L   Chloride 98 (L) 101 - 111 mmol/L   CO2 33 (H) 22 - 32 mmol/L   Glucose, Bld 85 65 - 99 mg/dL   BUN 7 6 - 20 mg/dL   Creatinine, Ser 0.60 0.44 - 1.00 mg/dL   Calcium 8.4 (L) 8.9 - 10.3 mg/dL   Total Protein 5.4 (L) 6.5 - 8.1 g/dL   Albumin 2.3 (L) 3.5 - 5.0 g/dL   AST 28 15 - 41 U/L   ALT 17 14 - 54 U/L   Alkaline Phosphatase 92 38 - 126 U/L   Total Bilirubin 0.7 0.3 - 1.2 mg/dL   GFR calc non Af Amer >60 >60 mL/min   GFR calc Af Amer >60 >60 mL/min    Comment: (NOTE) The eGFR has been calculated using the CKD EPI equation. This calculation has not been validated in all clinical situations. eGFR's persistently <60 mL/min signify possible Chronic Kidney Disease.    Anion gap 10 5 - 15  CBC     Status: Abnormal   Collection Time: 10/12/15  6:26 AM  Result Value Ref  Range   WBC 4.9 4.0 - 10.5 K/uL   RBC 4.13 3.87 - 5.11 MIL/uL   Hemoglobin 12.8 12.0 - 15.0 g/dL   HCT 39.8 36.0 - 46.0 %   MCV 96.4 78.0 - 100.0 fL   MCH 31.0 26.0 - 34.0 pg   MCHC 32.2 30.0 - 36.0 g/dL   RDW 14.6 11.5 - 15.5 %   Platelets 113 (L) 150 - 400 K/uL    Comment: SPECIMEN CHECKED FOR CLOTS REPEATED TO VERIFY CONSISTENT WITH PREVIOUS RESULT   Glucose, capillary     Status: None   Collection Time: 10/12/15  8:13 AM  Result Value Ref Range   Glucose-Capillary 93 65 - 99 mg/dL     Lipid Panel  No results found for: CHOL, TRIG, HDL, CHOLHDL, VLDL, LDLCALC, LDLDIRECT   Lab Results  Component Value Date   HGBA1C 5.9* 10/07/2015     Lab Results  Component Value Date   CREATININE 0.60 10/12/2015     HPI :82 y.o. Dalton with dementia who was found confused and undressed in her home. She is brought to the hospital where she was found to be febrile. Urinalysis revealed pyuria and hematuria. She was started on vancomycin and cefepime. Her urine culture is negative. Escherichia coli has grown from both sets of blood cultures. Enterococcus is growing from one set. She has defervesced  HOSPITAL COURSE:   Sepsis presented on admission with leukocytosis, lactic acidosis, acute metabolic encephalopathy secondary to ecoli UTI/Ecoli bacteremia/enterococcus in first set of blood culture as well Was started on empiric vancomycin and cefepime on admission,, cefepime changed to ceftriaxone Blood culture from 4/12+ ecoli, in addition, + enterococcus, second set of blood culture drawn on 4/15, echocardiogram did not show any evidence of vegetations, f/u culture from 4/15 no growth so far, clinically improving, leukocytosis and lactic acidosis has resolved, mental status has back to baseline per family. ID following , discussed with Dr. Megan Salon, patient would need 4 weeks of IV antibiotics Rocephin and vancomycin as no clear comment was made on the status of her AICD on her 2-D  echo  Diabetes mellitus, a1c 5.9 Diet controlled.    COPD with chronic hypoxic respiratory failure on home o2 Continue  O2 via nasal cannula. Stable. Continue when necessary albuterol nebs.   Coronary artery disease Continue aspirin, beta blocker, statin.  s/pMedtronic CRT-D / Bi-VentricularDefibrillator , Following MD:Dr. Elonda Husky at [facility], device last checked in 07/2015  Dementia Continue Aricept. Not oriented to time but to person and know she is in the hospital  ? Protein calorie malnutrition Nutrition evaluation.   Pedal edema, chronic systolic heart failure: trial of oral lasix x2 days, EF of 35-40%, continue Lasix 40 mg daily   Discharge Exam:   Blood pressure 155/79, pulse 76, temperature 98.6 F (37 C), temperature source Oral, resp. rate 16, height 5' 9"  (1.753 m), weight 54.1 kg (119 lb 4.3 oz), SpO2 94 %.  General appearance: alert, cooperative, no distress and pleasantly confused.  Resp: clear to auscultation bilaterally Chest wall: no tenderness, L sided AICD is non-tender, no fluctuance, no increase in warmth.  Cardio: regular rate and rhythm GI: normal findings: bowel sounds normal and soft, non-tender Extremities: edema none    Follow-up Information    Follow up with James P Thompson Md Pa, MD. Schedule an appointment as soon as possible for a visit in 3 days.   Specialty:  Internal Medicine   Contact information:   [address] [place] [address] [phone]       Signed: Reyne Dumas 10/12/2015, 11:04 AM        Time spent >Donna mins

## 2015-10-12 NOTE — Clinical Social Work Placement (Addendum)
   CLINICAL SOCIAL WORK PLACEMENT  NOTE 10/13/15 - [facility]   Date:  10/12/2015  Patient Details  Name: Donna Dalton MRN: [id number] Date of Birth: 1925-06-29  Clinical Social Work is seeking post-discharge placement for this patient at the Skilled  Nursing Facility level of care (*CSW will initial, date and re-position this form in  chart as items are completed):  Yes   Patient/family provided with [facility] Clinical Social Work Department's list of facilities offering this level of care within the geographic area requested by the patient (or if unable, by the patient's family).  Yes   Patient/family informed of their freedom to choose among providers that offer the needed level of care, that participate in Medicare, Medicaid or managed care program needed by the patient, have an available bed and are willing to accept the patient.  Yes   Patient/family informed of [facility]'s ownership interest in [facility] and [facility], as well as of the fact that they are under no obligation to receive care at these facilities.  PASRR submitted to EDS on       PASRR number received on       Existing PASRR number confirmed on 10/08/15     FL2 transmitted to all facilities in geographic area requested by pt/family on       FL2 transmitted to all facilities within larger geographic area on       Patient informed that his/her managed care company has contracts with or will negotiate with certain facilities, including the following:         10/12/15 - Patient/family informed of bed offers received.  [facility]     Physician recommends and patient chooses bed at      [facility].  Patient to be transferred to facility by  ambulance     Patient family notified on  10/13/15 of transfer.  Name of family member notified:   Ellery PlunkGreg Spinney [phone])     PHYSICIAN        Additional Comment:  10/12/15 - Patient will have PICC line placed before [facility] facility. 10/13/15 - PICC line placed on Wednesday.   _______________________________________________ Cristobal Goldmannrawford, Lulabelle Desta Bradley, LCSW 10/12/2015, 5:21 PM

## 2015-10-12 NOTE — Progress Notes (Signed)
Occupational Therapy Treatment Patient Details Name: Donna Dalton MRN: [id number] DOB: 04-16-1926 Today's Date: 10/12/2015    History of present illness 82 y.o. female has a past medical history of Coronary artery disease; Hypertension; GERD (gastroesophageal reflux disease); COPD (chronic obstructive pulmonary disease) (HCC); Stroke [facility](HCC); and Dementia; admitted with fever and confusion.   OT comments  Pt progressing. Plan is for pt to go to SNF. Pt's son present during session and ALF does not provide necessary assist/supervision, so agree with pt going SNF.   Follow Up Recommendations  SNF;Supervision/Assistance - 24 hour    Equipment Recommendations  Other (comment) (TBD-defer to next venue)    Recommendations for Other Services      Precautions / Restrictions Precautions Precautions: Fall Precaution Comments: monitor O2 Restrictions Weight Bearing Restrictions: No       Mobility Bed Mobility               General bed mobility comments: not assessed-pt in chair when OT arrived  Transfers Overall transfer level: Needs assistance Equipment used: Rolling walker (2 wheeled) Transfers: Sit to/from Stand Sit to Stand: Min guard         General transfer comment: cues for technique/hand placement.    Balance      Pt performed functional tasks while standing-supervision-Min guard while doing so.                             ADL Overall ADL's : Needs assistance/impaired     Grooming: Wash/dry hands;Oral care;Min guard;Standing (also wiped off part of face)       Lower Body Bathing: Supervison/ safety;Set up;Sitting/lateral leans Lower Body Bathing Details (indicate cue type and reason): rinsed off legs Upper Body Dressing : Minimal assistance;Sitting       Toilet Transfer: Min guard;Ambulation;RW;Grab bars;Regular Social workerToilet   Toileting- Clothing Manipulation and Hygiene: Min guard;Sit to/from stand       Functional mobility during ADLs: Landscape architectMin  guard;Rolling walker        Vision                     Perception     Praxis      Cognition  Awake/Alert Behavior During Therapy: WFL for tasks assessed/performed Overall Cognitive Status: History of cognitive impairments - at baseline                  General Comments: cues for walker management in session    Extremity/Trunk Assessment               Exercises     Shoulder Instructions       General Comments      Pertinent Vitals/ Pain       Pain Assessment: No/denies pain  Home Living                                          Prior Functioning/Environment              Frequency Min 2X/week     Progress Toward Goals  OT Goals(current goals can now be found in the care plan section)  Progress towards OT goals: Progressing toward goals-added a goal  Acute Rehab OT Goals Patient Stated Goal: not stated OT Goal Formulation: Patient unable to participate in goal setting Time For Goal Achievement: 10/14/15 Potential to Achieve Goals:  Good ADL Goals Pt Will Perform Grooming: with supervision;standing Pt Will Transfer to Toilet: with supervision;ambulating;bedside commode Pt Will Perform Toileting - Clothing Manipulation and hygiene: with supervision;sit to/from stand Additional ADL Goal #1: Pt will be supervision for functional ambulation/mobility during ADL using least restrictive assistive device Additional ADL Goal #2: Pt will be educated on a functional strengthening HEP   Plan Discharge plan remains appropriate    Co-evaluation                 End of Session Equipment Utilized During Treatment: Gait belt;Rolling walker   Activity Tolerance Patient tolerated treatment well   Patient Left in chair;with call bell/phone within reach;with chair alarm set;with family/visitor present   Nurse Communication          Time: 7829-5621 OT Time Calculation (min): 17 min  Charges: OT General Charges $OT  Visit: 1 Procedure OT Treatments $Self Care/Home Management : 8-22 mins  Earlie Raveling OTR/L [phone] 10/12/2015, 12:13 PM

## 2015-10-13 ENCOUNTER — Inpatient Hospital Stay (HOSPITAL_COMMUNITY): Payer: Medicare Other

## 2015-10-13 LAB — GLUCOSE, CAPILLARY: GLUCOSE-CAPILLARY: 87 mg/dL (ref 65–99)

## 2015-10-13 MED ORDER — HEPARIN SOD (PORK) LOCK FLUSH 100 UNIT/ML IV SOLN
250.0000 [IU] | INTRAVENOUS | Status: AC | PRN
  Administered 2015-10-13: 250 [IU]

## 2015-10-13 MED ORDER — SODIUM CHLORIDE 0.9% FLUSH
10.0000 mL | INTRAVENOUS | Status: DC | PRN
  Administered 2015-10-13: 10 mL
  Filled 2015-10-13: qty 40

## 2015-10-13 NOTE — Discharge Summary (Signed)
Physician Discharge Summary  Donna Dalton MRN: [id number] DOB/AGE: [birth year] 82 y.o.  PCP: [name], MD   Admit date: 10/06/2015 Discharge date: 10/13/2015  Discharge Diagnoses:     Active Problems:   Arteriosclerosis of coronary artery   Chronic obstructive pulmonary disease (HCC)   Essential (primary) hypertension   Borderline diabetes mellitus   Automatic implantable cardioverter-defibrillator in situ   Sepsis ([place])   Dementia with behavioral disturbance   Acute encephalopathy   Protein-calorie malnutrition, severe   Bacteremia due to Escherichia coli   Enterococcal bacteremia   Acute UTI (urinary tract infection)   Chronic respiratory failure with hypoxia (HCC)   On home oxygen therapy   Pressure ulcer    Follow-up recommendations Follow-up with PCP in 3-5 days , including all  additional recommended appointments as below Follow-up CBC, CMP in 3-5 days Continue abx until 5/13      Medication List    TAKE these medications        amLODipine 5 MG tablet  Commonly known as:  NORVASC  Take 5 mg by mouth.     aspirin 81 MG chewable tablet  Chew 81 mg by mouth daily.     cefTRIAXone 2 g in dextrose 5 % 50 mL  Inject 2 g into the vein daily.     donepezil 5 MG tablet  Commonly known as:  ARICEPT  TAKE 1 TABLET (5 MG TOTAL) BY MOUTH NIGHTLY.     furosemide 40 MG tablet  Commonly known as:  LASIX  Take 1 tablet (40 mg total) by mouth daily.     guaiFENesin 600 MG 12 hr tablet  Commonly known as:  MUCINEX  Take 1 tablet (600 mg total) by mouth 2 (two) times daily.     HYDROcodone-acetaminophen 5-325 MG tablet  Commonly known as:  NORCO/VICODIN  Take 1 tablet by mouth every 6 (six) hours as needed for moderate pain.     hydrOXYzine 10 MG tablet  Commonly known as:  ATARAX/VISTARIL  Take 10 mg by mouth daily as needed for itching.     isosorbide mononitrate 10 MG tablet  Commonly known as:  ISMO,MONOKET  TAKE 1 TABLET (10 MG TOTAL) BY MOUTH  TWO (2) TIMES A DAY.     nebivolol 2.5 MG tablet  Commonly known as:  BYSTOLIC  Take 2.5 mg by mouth daily.     nitroGLYCERIN 0.4 MG SL tablet  Commonly known as:  NITROSTAT  Place 0.4 mg under the tongue every 5 (five) minutes as needed for chest pain.     omeprazole 20 MG capsule  Commonly known as:  PRILOSEC  Take 20 mg by mouth daily.     pravastatin 20 MG tablet  Commonly known as:  PRAVACHOL  TAKE 1 TABLET (20 MG TOTAL) BY MOUTH EVERY EVENING.     Vancomycin 750 MG/150ML Soln  Commonly known as:  VANCOCIN  Inject 150 mLs (750 mg total) into the vein daily.     VITAMIN D PO  Take 50 mg by mouth once.         Discharge Condition: stable    Discharge Instructions Get Medicines reviewed and adjusted: Please take all your medications with you for your next visit with your Primary MD  Please request your Primary MD to go over all hospital tests and procedure/radiological results at the follow up, please ask your Primary MD to get all Hospital records sent to his/her office.  If you experience worsening of your admission symptoms, develop shortness of  breath, life threatening emergency, suicidal or homicidal thoughts you must seek medical attention immediately by calling 911 or calling your MD immediately if symptoms less severe.  You must read complete instructions/literature along with all the possible adverse reactions/side effects for all the Medicines you take and that have been prescribed to you. Take any new Medicines after you have completely understood and accpet all the possible adverse reactions/side effects.   Do not drive when taking Pain medications.   Do not take more than prescribed Pain, Sleep and Anxiety Medications  Special Instructions: If you have smoked or chewed Tobacco in the last 2 yrs please stop smoking, stop any regular Alcohol and or any Recreational drug use.  Wear Seat belts while driving.  Please note  You were cared for by a  hospitalist during your hospital stay. Once you are discharged, your primary care physician will handle any further medical issues. Please note that NO REFILLS for any discharge medications will be authorized once you are discharged, as it is imperative that you return to your primary care physician (or establish a relationship with a primary care physician if you do not have one) for your aftercare needs so that they can reassess your need for medications and monitor your lab values.  Discharge Instructions    Diet - low sodium heart healthy    Complete by:  As directed      Diet - low sodium heart healthy    Complete by:  As directed      Diet - low sodium heart healthy    Complete by:  As directed      Increase activity slowly    Complete by:  As directed      Increase activity slowly    Complete by:  As directed      Increase activity slowly    Complete by:  As directed            Allergies  Allergen Reactions  . Penicillins Rash    Hot feeling all over Has patient had a PCN reaction causing immediate rash, facial/tongue/throat swelling, SOB or lightheadedness with hypotension:YES Has patient had a PCN reaction causing severe rash involving mucus membranes or skin necrosis: NO Has patient had a PCN reaction that required hospitalization NO Has patient had a PCN reaction occurring within the last 10 years: NO If all of the above answers are "NO", then may proceed with Cephalosporin use.        Disposition: SNF for rehab   Consults: Infectious disease    Significant Diagnostic Studies:  Dg Chest 2 View  10/06/2015  CLINICAL DATA:  Shortness of breath for 1 day.  Fever. EXAM: CHEST  2 VIEW COMPARISON:  August 12, 2015 and Nov 22, 2013 FINDINGS: Lungs are hyperexpanded. There is scarring in the right upper lobe region, stable. There is no edema or consolidation. Heart is upper normal in size with pulmonary vascularity within normal limits. Pacemaker leads are attached to  the right atrium, right ventricle, and left ventricle. Bones are osteoporotic. No adenopathy evident. IMPRESSION: Scarring right upper lobe. Lungs hyperexpanded. No edema or consolidation. Stable cardiac silhouette. Electronically Signed   By: Lowella Grip III M.D.   On: 10/06/2015 19:07   Dg Chest Port 1 View  10/13/2015  CLINICAL DATA:  82 year old Dalton status post right PICC line placement. Initial encounter. EXAM: PORTABLE CHEST 1 VIEW COMPARISON:  10/06/2015 and earlier. FINDINGS: Two Portable AP upright views at 0932 hours. The right upper extremity  PICC line courses to the mediastinum and terminates at the level of the carina. The tip is partially obscured by the left chest AICD lead installation. Stable large lung volumes. No pneumothorax. The patient is rotated to the left, mediastinal contours are grossly stable. No acute pulmonary opacity. IMPRESSION: 1. Right IJ PICC line tip at the level of the SVC. 2.  No acute cardiopulmonary abnormality. Electronically Signed   By: Genevie Takirah M.D.   On: 10/13/2015 09:53    2-D echo LV EF: 35% - 40%  ------------------------------------------------------------------- Indications: Bacteremia 790.7.  ------------------------------------------------------------------- History: PMH: Dementia. Pedal edema. Coronary artery disease. Chronic obstructive pulmonary disease. Risk factors: Diabetes mellitus.  ------------------------------------------------------------------- Study Conclusions  - Left ventricle: Abnormal septal motion inferior wall hypokinesis  The cavity size was mildly dilated. Wall thickness was normal.  Systolic function was moderately reduced. The estimated ejection  fraction was in the range of 35% to 40%. - Mitral valve: There was moderate regurgitation. - Left atrium: The atrium was mildly dilated. - Right atrium: The atrium was mildly dilated. - Atrial septum: No defect or patent foramen ovale was  identified.    Filed Weights   10/08/15 2006 10/09/15 2009 10/10/15 2116  Weight: 51.9 kg (114 lb 6.7 oz) 51.3 kg (113 lb 1.5 oz) 54.1 kg (119 lb 4.3 oz)     Microbiology: Recent Results (from the past 240 hour(s))  Blood culture (routine x 2)     Status: Abnormal   Collection Time: 10/06/15  6:30 PM  Result Value Ref Range Status   Specimen Description BLOOD RIGHT ANTECUBITAL  Final   Special Requests BOTTLES DRAWN AEROBIC AND ANAEROBIC 5CC  Final   Culture  Setup Time   Final    GRAM NEGATIVE RODS IN BOTH AEROBIC AND ANAEROBIC BOTTLES CRITICAL RESULT CALLED TO, READ BACK BY AND VERIFIED WITH: R HILL,RN AT 0901 10/07/15 BY L BENFIELD    Culture (A)  Final    ESCHERICHIA COLI ENTEROCOCCUS SPECIES CRITICAL RESULT CALLED TO, READ BACK BY AND VERIFIED WITH: A. BRAKE,RN AT 4401 ON [id number] BY Rhea Bleacher Performed at [facility]    Report Status 10/10/2015 FINAL  Final   Organism ID, Bacteria ESCHERICHIA COLI  Final   Organism ID, Bacteria ENTEROCOCCUS SPECIES  Final      Susceptibility   Escherichia coli - MIC*    AMPICILLIN >=32 RESISTANT Resistant     CEFAZOLIN <=4 SENSITIVE Sensitive     CEFEPIME <=1 SENSITIVE Sensitive     CEFTAZIDIME <=1 SENSITIVE Sensitive     CEFTRIAXONE <=1 SENSITIVE Sensitive     CIPROFLOXACIN <=0.25 SENSITIVE Sensitive     GENTAMICIN <=1 SENSITIVE Sensitive     IMIPENEM <=0.25 SENSITIVE Sensitive     TRIMETH/SULFA <=20 SENSITIVE Sensitive     AMPICILLIN/SULBACTAM 8 SENSITIVE Sensitive     PIP/TAZO <=4 SENSITIVE Sensitive     * ESCHERICHIA COLI   Enterococcus species - MIC*    AMPICILLIN <=2 SENSITIVE Sensitive     VANCOMYCIN 1 SENSITIVE Sensitive     GENTAMICIN SYNERGY SENSITIVE Sensitive     * ENTEROCOCCUS SPECIES  Blood culture (routine x 2)     Status: Abnormal   Collection Time: 10/06/15  7:25 PM  Result Value Ref Range Status   Specimen Description BLOOD LEFT ANTECUBITAL  Final   Special Requests BOTTLES DRAWN AEROBIC AND  ANAEROBIC 3CC  Final   Culture  Setup Time   Final    GRAM NEGATIVE RODS AEROBIC BOTTLE ONLY CRITICAL RESULT CALLED  TO, READ BACK BY AND VERIFIED WITH: Lambert Keto [id number] ON [id number] BY Rhea Bleacher    Culture (A)  Final    ESCHERICHIA COLI SUSCEPTIBILITIES PERFORMED ON PREVIOUS CULTURE WITHIN THE LAST 5 DAYS. Performed at [facility]    Report Status 10/10/2015 FINAL  Final  MRSA PCR Screening     Status: None   Collection Time: 10/07/15  1:40 AM  Result Value Ref Range Status   MRSA by PCR NEGATIVE NEGATIVE Final    Comment:        The GeneXpert MRSA Assay (FDA approved for NASAL specimens only), is one component of a comprehensive MRSA colonization surveillance program. It is not intended to diagnose MRSA infection nor to guide or monitor treatment for MRSA infections.   Urine culture     Status: None   Collection Time: 10/08/15  2:18 AM  Result Value Ref Range Status   Specimen Description URINE, CLEAN CATCH  Final   Special Requests NONE  Final   Culture NO GROWTH 1 DAY  Final   Report Status 10/09/2015 FINAL  Final  Culture, blood (routine x 2)     Status: None (Preliminary result)   Collection Time: 10/09/15 12:05 AM  Result Value Ref Range Status   Specimen Description BLOOD RIGHT HAND  Final   Special Requests BOTTLES DRAWN AEROBIC AND ANAEROBIC 5CC  Final   Culture NO GROWTH 3 DAYS  Final   Report Status PENDING  Incomplete  Culture, blood (routine x 2)     Status: None (Preliminary result)   Collection Time: 10/09/15 12:10 AM  Result Value Ref Range Status   Specimen Description BLOOD RIGHT ANTECUBITAL  Final   Special Requests BOTTLES DRAWN AEROBIC AND ANAEROBIC 5CC  Final   Culture NO GROWTH 3 DAYS  Final   Report Status PENDING  Incomplete       Blood Culture    Component Value Date/Time   SDES BLOOD RIGHT ANTECUBITAL 10/09/2015 0010   SPECREQUEST BOTTLES DRAWN AEROBIC AND ANAEROBIC 5CC 10/09/2015 0010   CULT NO GROWTH 3 DAYS 10/09/2015  0010   REPTSTATUS PENDING 10/09/2015 0010      Labs: Results for orders placed or performed during the hospital encounter of 10/06/15 (from the past 48 hour(s))  Glucose, capillary     Status: None   Collection Time: 10/11/15 12:12 PM  Result Value Ref Range   Glucose-Capillary Donna 65 - 99 mg/dL  Glucose, capillary     Status: Abnormal   Collection Time: 10/11/15  5:03 PM  Result Value Ref Range   Glucose-Capillary 116 (H) 65 - 99 mg/dL  Glucose, capillary     Status: None   Collection Time: 10/11/15  9:27 PM  Result Value Ref Range   Glucose-Capillary 83 65 - 99 mg/dL  Comprehensive metabolic panel     Status: Abnormal   Collection Time: 10/12/15  6:26 AM  Result Value Ref Range   Sodium 141 135 - 145 mmol/L   Potassium 3.8 3.5 - 5.1 mmol/L   Chloride 98 (L) 101 - 111 mmol/L   CO2 33 (H) 22 - 32 mmol/L   Glucose, Bld 85 65 - 99 mg/dL   BUN 7 6 - 20 mg/dL   Creatinine, Ser 0.60 0.44 - 1.00 mg/dL   Calcium 8.4 (L) 8.9 - 10.3 mg/dL   Total Protein 5.4 (L) 6.5 - 8.1 g/dL   Albumin 2.3 (L) 3.5 - 5.0 g/dL   AST 28 15 - 41 U/L   ALT  17 14 - 54 U/L   Alkaline Phosphatase 92 38 - 126 U/L   Total Bilirubin 0.7 0.3 - 1.2 mg/dL   GFR calc non Af Amer >60 >60 mL/min   GFR calc Af Amer >60 >60 mL/min    Comment: (NOTE) The eGFR has been calculated using the CKD EPI equation. This calculation has not been validated in all clinical situations. eGFR's persistently <60 mL/min signify possible Chronic Kidney Disease.    Anion gap 10 5 - 15  CBC     Status: Abnormal   Collection Time: 10/12/15  6:26 AM  Result Value Ref Range   WBC 4.9 4.0 - 10.5 K/uL   RBC 4.13 3.87 - 5.11 MIL/uL   Hemoglobin 12.8 12.0 - 15.0 g/dL   HCT 39.8 36.0 - 46.0 %   MCV 96.4 78.0 - 100.0 fL   MCH 31.0 26.0 - 34.0 pg   MCHC 32.2 30.0 - 36.0 g/dL   RDW 14.6 11.5 - 15.5 %   Platelets 113 (L) 150 - 400 K/uL    Comment: SPECIMEN CHECKED FOR CLOTS REPEATED TO VERIFY CONSISTENT WITH PREVIOUS RESULT    Glucose, capillary     Status: None   Collection Time: 10/12/15  8:13 AM  Result Value Ref Range   Glucose-Capillary 93 65 - 99 mg/dL  Glucose, capillary     Status: Abnormal   Collection Time: 10/12/15 12:07 PM  Result Value Ref Range   Glucose-Capillary 138 (H) 65 - 99 mg/dL  Glucose, capillary     Status: Abnormal   Collection Time: 10/12/15  4:13 PM  Result Value Ref Range   Glucose-Capillary 162 (H) 65 - 99 mg/dL  Glucose, capillary     Status: None   Collection Time: 10/12/15 10:55 PM  Result Value Ref Range   Glucose-Capillary 85 65 - 99 mg/dL  Glucose, capillary     Status: None   Collection Time: 10/13/15  7:58 AM  Result Value Ref Range   Glucose-Capillary 87 65 - 99 mg/dL     Lipid Panel  No results found for: CHOL, TRIG, HDL, CHOLHDL, VLDL, LDLCALC, LDLDIRECT   Lab Results  Component Value Date   HGBA1C 5.9* 10/07/2015     Lab Results  Component Value Date   CREATININE 0.60 10/12/2015     HPI :82 y.o. Dalton with dementia who was found confused and undressed in her home. She is brought to the hospital where she was found to be febrile. Urinalysis revealed pyuria and hematuria. She was started on vancomycin and cefepime. Her urine culture is negative. Escherichia coli has grown from both sets of blood cultures. Enterococcus is growing from one set. She has defervesced  HOSPITAL COURSE:   Sepsis presented on admission with leukocytosis, lactic acidosis, acute metabolic encephalopathy secondary to ecoli UTI/Ecoli bacteremia/enterococcus in first set of blood culture as well Was started on empiric vancomycin and cefepime on admission,, cefepime changed to ceftriaxone Blood culture from 4/12+ ecoli, in addition, + enterococcus, second set of blood culture drawn on 4/15, echocardiogram did not show any evidence of vegetations, f/u culture from 4/15 no growth so far, clinically improving, leukocytosis and lactic acidosis has resolved, mental status has back to  baseline per family. ID following , discussed with Dr. Megan Salon, patient would need 4 weeks of IV antibiotics Rocephin and vancomycin as no clear comment was made on the status of her AICD on her 2-D echo  Diabetes mellitus, a1c 5.9 Diet controlled.    COPD with chronic hypoxic  respiratory failure on home o2 Continue O2 via nasal cannula. Stable. Continue when necessary albuterol nebs.   Coronary artery disease Continue aspirin, beta blocker, statin.  s/pMedtronic CRT-D / Bi-VentricularDefibrillator , Following MD:[facility] Elonda [facility] at [facility], device last checked in 07/2015  Dementia Continue Aricept. Not oriented to time but to person and know she is in the hospital  ? Protein calorie malnutrition Nutrition evaluation.   Pedal edema, chronic systolic heart failure: trial of oral lasix x2 days, EF of 35-40%, continue Lasix 40 mg daily, follow up with Dr Elonda Husky    Discharge Exam:   Blood pressure 136/72, pulse 76, temperature 98 F (36.7 C), temperature source Oral, resp. rate 16, height 5' 9"  (1.753 m), weight 54.1 kg (119 lb 4.3 oz), SpO2 94 %.  General appearance: alert, cooperative, no distress and pleasantly confused.  Resp: clear to auscultation bilaterally Chest wall: no tenderness, L sided AICD is non-tender, no fluctuance, no increase in warmth.  Cardio: regular rate and rhythm GI: normal findings: bowel sounds normal and soft, non-tender Extremities: edema none    Follow-up Information    Follow up with [facility], MD. Schedule an appointment as soon as possible for a visit in 3 days.   Specialty:  Internal Medicine   Contact information:   [address] [place] [address] [phone]       Signed: Reyne Dumas 10/13/2015, 10:18 AM        Time spent >45 mins

## 2015-10-13 NOTE — Care Management Note (Signed)
Case Management Note  Patient Details  Name: Kaliya Shreiner MRN: [id number] Date of Birth: January 22, 1926  Subjective/Objective:      CM following for progression and d/c planning.              Action/Plan: 10/10/2015 Met with pt and son, to discuss d/c needs, pt lives independently at the [facility] in [facility]. The son has arranged for a [place] aide to work with the pt starting May 1st, 2017 and she currently has [place] [facility] RN and PT services. Per Alvis Lemmings this pt requires a higher level of care and needs 24 hr supervision. This was discussed with son and the limitations of [place] services discussed. The pt son and pt agree that short term SNF for rehab and 24 hr supervision is needed. CSW notified.    Expected Discharge Date:                  Expected Discharge Plan:  Skilled Nursing Facility  In-House Referral:  Clinical Social Work  Discharge planning Services  CM Consult  Post Acute Care Choice:  NA Choice offered to:  NA  DME Arranged:   NA DME Agency:   NA  HH Arranged:   NA HH Agency:   NA  Status of Service:  Completed, signed off  Medicare Important Message Given:  Yes Date Medicare IM Given:    Medicare IM give by:    Date Additional Medicare IM Given:    Additional Medicare Important Message give by:     If discussed at [facility] of Stay Meetings, dates discussed:    Additional Comments:  Adron Bene, RN 10/13/2015, 11:12 AM

## 2015-10-13 NOTE — Progress Notes (Signed)
Donna Dalton to be D/C'd Home per MD order.  Discussed prescriptions and follow up appointments with the patient. Prescriptions given to patient, medication list explained in detail. Pt verbalized understanding.    Medication List    TAKE these medications        amLODipine 5 MG tablet  Commonly known as:  NORVASC  Take 5 mg by mouth.     aspirin 81 MG chewable tablet  Chew 81 mg by mouth daily.     cefTRIAXone 2 g in dextrose 5 % 50 mL  Inject 2 g into the vein daily.     donepezil 5 MG tablet  Commonly known as:  ARICEPT  TAKE 1 TABLET (5 MG TOTAL) BY MOUTH NIGHTLY.     furosemide 20 MG tablet  Commonly known as:  LASIX  Take 2 tablets (40 mg total) by mouth daily.     guaiFENesin 600 MG 12 hr tablet  Commonly known as:  MUCINEX  Take 1 tablet (600 mg total) by mouth 2 (two) times daily.     HYDROcodone-acetaminophen 5-325 MG tablet  Commonly known as:  NORCO/VICODIN  Take 1 tablet by mouth every 6 (six) hours as needed for moderate pain.     hydrOXYzine 10 MG tablet  Commonly known as:  ATARAX/VISTARIL  Take 10 mg by mouth daily as needed for itching.     isosorbide mononitrate 10 MG tablet  Commonly known as:  ISMO,MONOKET  TAKE 1 TABLET (10 MG TOTAL) BY MOUTH TWO (2) TIMES A DAY.     nebivolol 2.5 MG tablet  Commonly known as:  BYSTOLIC  Take 2.5 mg by mouth daily.     nitroGLYCERIN 0.4 MG SL tablet  Commonly known as:  NITROSTAT  Place 0.4 mg under the tongue every 5 (five) minutes as needed for chest pain.     omeprazole 20 MG capsule  Commonly known as:  PRILOSEC  Take 20 mg by mouth daily.     pravastatin 20 MG tablet  Commonly known as:  PRAVACHOL  TAKE 1 TABLET (20 MG TOTAL) BY MOUTH EVERY EVENING.     Vancomycin 750 MG/150ML Soln  Commonly known as:  VANCOCIN  Inject 150 mLs (750 mg total) into the vein daily.     VITAMIN D PO  Take 50 mg by mouth once.        Filed Vitals:   10/13/15 0716 10/13/15 0834  BP: 126/92 136/72  Pulse: 70 76   Temp: 97.6 F (36.4 C) 98 F (36.7 C)  Resp: 18 16    Skin clean, dry and intact without evidence of skin break down, no evidence of skin tears noted. IV catheter discontinued intact. Site without signs and symptoms of complications. Dressing and pressure applied. Pt denies pain at this time. No complaints noted.  An After Visit Summary was printed and given to the patient. Patient escorted via stretcher, and D/C home via ambulance.  Donna ForehandLuke Worthington Cruzan BSN, RN

## 2015-10-13 NOTE — Progress Notes (Signed)
10/13/2015 1:01 PM  [facility] called and inquired about patient last dose of ABT and how many liters of oxygen patient was on while inpatient. Informed Music therapistLuke RN. Luke informed me last Vanc ABT was 4/18 2305 and oxygen was at 2L. Check eMAR. [facility] verbalized no more concerns.   PACCAR Incyanne Hill BSN, RN-BC, [employer] [facility] Phone [phone]

## 2015-10-13 NOTE — Progress Notes (Signed)
Peripherally Inserted Central Catheter/Midline Placement  The IV Nurse has discussed with the patient and/or persons authorized to consent for the patient, the purpose of this procedure and the potential benefits and risks involved with this procedure.  The benefits include less needle sticks, lab draws from the catheter and patient may be discharged home with the catheter.  Risks include, but not limited to, infection, bleeding, blood clot (thrombus formation), and puncture of an artery; nerve damage and irregular heat beat.  Alternatives to this procedure were also discussed.  PICC/Midline Placement Documentation        Donna Dalton, Donna Dalton 10/13/2015, 9:22 AM

## 2015-10-14 LAB — CULTURE, BLOOD (ROUTINE X 2)
CULTURE: NO GROWTH
CULTURE: NO GROWTH

## 2015-10-15 ENCOUNTER — Other Ambulatory Visit (HOSPITAL_COMMUNITY): Payer: Self-pay | Admitting: Geriatric Medicine

## 2015-10-15 ENCOUNTER — Ambulatory Visit (HOSPITAL_COMMUNITY)
Admission: RE | Admit: 2015-10-15 | Discharge: 2015-10-15 | Disposition: A | Discharge status: Home / Self Care | Payer: Medicare Other | Source: Ambulatory Visit | Attending: Geriatric Medicine | Admitting: Geriatric Medicine

## 2015-10-15 DIAGNOSIS — B999 Unspecified infectious disease: Secondary | ICD-10-CM | POA: Insufficient documentation

## 2015-10-15 DIAGNOSIS — Z792 Long term (current) use of antibiotics: Secondary | ICD-10-CM

## 2015-10-15 MED ORDER — LIDOCAINE HCL 1 % IJ SOLN
INTRAMUSCULAR | Status: AC
  Filled 2015-10-15: qty 20

## 2015-10-15 MED ORDER — HEPARIN SOD (PORK) LOCK FLUSH 100 UNIT/ML IV SOLN
INTRAVENOUS | Status: AC
  Filled 2015-10-15: qty 5

## 2015-10-16 ENCOUNTER — Telehealth: Payer: Self-pay | Admitting: Infectious Diseases

## 2015-10-16 NOTE — Telephone Encounter (Signed)
spoke with pt's PCP- she has repeatedly pulled out her PIC lines.  Last atbx were 4-21 (10 days of cephalosporin and vanco) Will ask if she can be tried on augmentin. She has PEN allergy- rash, but is in observed setting.  Called and left VM on PCP's phone

## 2016-07-14 ENCOUNTER — Encounter (HOSPITAL_BASED_OUTPATIENT_CLINIC_OR_DEPARTMENT_OTHER): Payer: Medicare Other | Attending: Internal Medicine

## 2016-07-14 DIAGNOSIS — I2510 Atherosclerotic heart disease of native coronary artery without angina pectoris: Secondary | ICD-10-CM | POA: Diagnosis not present

## 2016-07-14 DIAGNOSIS — F0390 Unspecified dementia without behavioral disturbance: Secondary | ICD-10-CM | POA: Insufficient documentation

## 2016-07-14 DIAGNOSIS — I70233 Atherosclerosis of native arteries of right leg with ulceration of ankle: Secondary | ICD-10-CM | POA: Insufficient documentation

## 2016-07-14 DIAGNOSIS — I70243 Atherosclerosis of native arteries of left leg with ulceration of ankle: Secondary | ICD-10-CM | POA: Diagnosis not present

## 2016-07-14 DIAGNOSIS — L97321 Non-pressure chronic ulcer of left ankle limited to breakdown of skin: Secondary | ICD-10-CM | POA: Diagnosis not present

## 2016-07-14 DIAGNOSIS — I10 Essential (primary) hypertension: Secondary | ICD-10-CM | POA: Insufficient documentation

## 2016-07-14 DIAGNOSIS — J449 Chronic obstructive pulmonary disease, unspecified: Secondary | ICD-10-CM | POA: Diagnosis not present

## 2016-07-14 DIAGNOSIS — L97312 Non-pressure chronic ulcer of right ankle with fat layer exposed: Secondary | ICD-10-CM | POA: Diagnosis not present

## 2016-07-19 ENCOUNTER — Ambulatory Visit (HOSPITAL_COMMUNITY)
Admission: RE | Admit: 2016-07-19 | Discharge: 2016-07-19 | Disposition: A | Discharge status: Home / Self Care | Payer: Medicare Other | Source: Ambulatory Visit | Attending: Vascular Surgery | Admitting: Vascular Surgery

## 2016-07-19 ENCOUNTER — Other Ambulatory Visit: Payer: Self-pay | Admitting: Internal Medicine

## 2016-07-19 DIAGNOSIS — IMO0002 Reserved for concepts with insufficient information to code with codable children: Secondary | ICD-10-CM

## 2016-07-19 DIAGNOSIS — I743 Embolism and thrombosis of arteries of the lower extremities: Secondary | ICD-10-CM | POA: Diagnosis not present

## 2016-07-19 DIAGNOSIS — L98499 Non-pressure chronic ulcer of skin of other sites with unspecified severity: Secondary | ICD-10-CM | POA: Diagnosis not present

## 2016-07-20 ENCOUNTER — Ambulatory Visit (HOSPITAL_COMMUNITY)
Admission: RE | Admit: 2016-07-20 | Discharge: 2016-07-20 | Disposition: A | Discharge status: Home / Self Care | Payer: Medicare Other | Source: Ambulatory Visit | Attending: Vascular Surgery | Admitting: Vascular Surgery

## 2016-07-20 ENCOUNTER — Other Ambulatory Visit: Payer: Self-pay | Admitting: Internal Medicine

## 2016-07-20 ENCOUNTER — Other Ambulatory Visit: Payer: Self-pay | Admitting: Vascular Surgery

## 2016-07-20 DIAGNOSIS — IMO0002 Reserved for concepts with insufficient information to code with codable children: Secondary | ICD-10-CM

## 2016-07-20 DIAGNOSIS — I872 Venous insufficiency (chronic) (peripheral): Secondary | ICD-10-CM | POA: Diagnosis not present

## 2016-07-20 DIAGNOSIS — L98499 Non-pressure chronic ulcer of skin of other sites with unspecified severity: Secondary | ICD-10-CM | POA: Insufficient documentation

## 2016-07-21 ENCOUNTER — Telehealth: Payer: Self-pay

## 2016-07-21 DIAGNOSIS — I70243 Atherosclerosis of native arteries of left leg with ulceration of ankle: Secondary | ICD-10-CM | POA: Diagnosis not present

## 2016-07-21 NOTE — Telephone Encounter (Signed)
[facility] referral for LE ulcers- spoke with son to make apt.

## 2016-08-04 ENCOUNTER — Encounter (HOSPITAL_BASED_OUTPATIENT_CLINIC_OR_DEPARTMENT_OTHER): Payer: Medicare Other | Attending: Internal Medicine

## 2016-08-04 DIAGNOSIS — I2510 Atherosclerotic heart disease of native coronary artery without angina pectoris: Secondary | ICD-10-CM | POA: Insufficient documentation

## 2016-08-04 DIAGNOSIS — L97312 Non-pressure chronic ulcer of right ankle with fat layer exposed: Secondary | ICD-10-CM | POA: Insufficient documentation

## 2016-08-04 DIAGNOSIS — J449 Chronic obstructive pulmonary disease, unspecified: Secondary | ICD-10-CM | POA: Diagnosis not present

## 2016-08-04 DIAGNOSIS — I10 Essential (primary) hypertension: Secondary | ICD-10-CM | POA: Insufficient documentation

## 2016-08-04 DIAGNOSIS — F0390 Unspecified dementia without behavioral disturbance: Secondary | ICD-10-CM | POA: Insufficient documentation

## 2016-08-04 DIAGNOSIS — I70233 Atherosclerosis of native arteries of right leg with ulceration of ankle: Secondary | ICD-10-CM | POA: Diagnosis present

## 2016-08-04 DIAGNOSIS — L97321 Non-pressure chronic ulcer of left ankle limited to breakdown of skin: Secondary | ICD-10-CM | POA: Diagnosis not present

## 2016-08-08 ENCOUNTER — Encounter: Payer: Self-pay | Admitting: Family

## 2016-08-16 ENCOUNTER — Encounter: Payer: Medicare Other | Admitting: Vascular Surgery

## 2016-11-25 ENCOUNTER — Emergency Department (HOSPITAL_COMMUNITY)

## 2016-11-25 ENCOUNTER — Encounter (HOSPITAL_COMMUNITY): Payer: Self-pay

## 2016-11-25 ENCOUNTER — Emergency Department (HOSPITAL_COMMUNITY)
Admission: EM | Admit: 2016-11-25 | Discharge: 2016-11-26 | Disposition: A | Discharge status: Home / Self Care | Attending: Emergency Medicine | Admitting: Emergency Medicine

## 2016-11-25 DIAGNOSIS — I2510 Atherosclerotic heart disease of native coronary artery without angina pectoris: Secondary | ICD-10-CM | POA: Insufficient documentation

## 2016-11-25 DIAGNOSIS — J449 Chronic obstructive pulmonary disease, unspecified: Secondary | ICD-10-CM | POA: Diagnosis not present

## 2016-11-25 DIAGNOSIS — Z79899 Other long term (current) drug therapy: Secondary | ICD-10-CM | POA: Diagnosis not present

## 2016-11-25 DIAGNOSIS — Z23 Encounter for immunization: Secondary | ICD-10-CM | POA: Insufficient documentation

## 2016-11-25 DIAGNOSIS — Y939 Activity, unspecified: Secondary | ICD-10-CM | POA: Diagnosis not present

## 2016-11-25 DIAGNOSIS — Y999 Unspecified external cause status: Secondary | ICD-10-CM | POA: Insufficient documentation

## 2016-11-25 DIAGNOSIS — W2209XA Striking against other stationary object, initial encounter: Secondary | ICD-10-CM | POA: Diagnosis not present

## 2016-11-25 DIAGNOSIS — S8992XA Unspecified injury of left lower leg, initial encounter: Secondary | ICD-10-CM | POA: Diagnosis present

## 2016-11-25 DIAGNOSIS — Y929 Unspecified place or not applicable: Secondary | ICD-10-CM | POA: Diagnosis not present

## 2016-11-25 DIAGNOSIS — S85142A Laceration of anterior tibial artery, left leg, initial encounter: Secondary | ICD-10-CM | POA: Insufficient documentation

## 2016-11-25 DIAGNOSIS — Z87891 Personal history of nicotine dependence: Secondary | ICD-10-CM | POA: Diagnosis not present

## 2016-11-25 DIAGNOSIS — Z7982 Long term (current) use of aspirin: Secondary | ICD-10-CM | POA: Insufficient documentation

## 2016-11-25 DIAGNOSIS — I10 Essential (primary) hypertension: Secondary | ICD-10-CM | POA: Insufficient documentation

## 2016-11-25 MED ORDER — LIDOCAINE HCL (PF) 1 % IJ SOLN
30.0000 mL | Freq: Once | INTRAMUSCULAR | Status: AC
  Administered 2016-11-26: 30 mL via INTRADERMAL
  Filled 2016-11-25: qty 30

## 2016-11-25 MED ORDER — TETANUS-DIPHTH-ACELL PERTUSSIS 5-2.5-18.5 LF-MCG/0.5 IM SUSP
0.5000 mL | Freq: Once | INTRAMUSCULAR | Status: AC
  Administered 2016-11-26: 0.5 mL via INTRAMUSCULAR
  Filled 2016-11-25: qty 0.5

## 2016-11-25 NOTE — ED Notes (Signed)
[facility] Expected date:  Expected time:  Means of arrival:  Comments: [id number] cut leg on recliner

## 2016-11-25 NOTE — ED Triage Notes (Signed)
States cut left lower leg on recliner no bleeding voiced or noted EMS put on dressing and transported to ER unknown last date of tetanus shot.

## 2016-11-25 NOTE — ED Provider Notes (Signed)
Patient seen and evaluated. Has 4cm L shaped laceration antral medial left lower leg fracture.ApproximationwillbeperformedbyLisaSaundersPA-C.   Donna PorterJames, Donna Vernet, MD 11/25/16 [phone]

## 2016-11-25 NOTE — ED Provider Notes (Signed)
[facility]-EMERGENCY DEPT Provider Note   CSN: [id number] Arrival date & time: 11/25/16  2155     History   Chief Complaint Chief Complaint  Patient presents with  . Extremity Laceration    HPI Donna Dalton is a 83 y.o. female.  The history is provided by medical records, a relative and the EMS personnel.    LEVEL V CAVEAT: DEMENTIA 83 y.o. F with hx of COPD, CAD, dementia, HTN, GERD, hx of stroke, Presenting to the ED with laceration of the left leg. Patient arrives with her son who is unaware of injury. EMS reports apparently she cut her leg on the edge of her power lift chair on a metal bracket. She does have a 5 cm laceration to the left anterior shin, but she also has bruising noted to the right lower leg.  Son states he did not notice this until just now.  States he was not told of any falls or other injuries noted.  Date of last tetanus unknown.  States patient does not walk much at baseline.  Son reports she is on some hospice care.  Past Medical History:  Diagnosis Date  . COPD (chronic obstructive pulmonary disease) (HCC)   . Coronary artery disease   . Dementia   . GERD (gastroesophageal reflux disease)   . Hypertension   . Stroke [facility])     Patient Active Problem List   Diagnosis Date Noted  . Pressure ulcer 10/11/2015  . Acute UTI (urinary tract infection)   . Chronic respiratory failure with hypoxia (HCC)   . On home oxygen therapy   . Protein-calorie malnutrition, severe 10/09/2015  . Bacteremia due to Escherichia coli 10/09/2015  . Enterococcal bacteremia 10/09/2015  . Sepsis (HCC) 10/06/2015  . Dementia with behavioral disturbance 10/06/2015  . Acute encephalopathy 10/06/2015  . Distal radius fracture, right 08/17/2015  . Borderline diabetes mellitus 01/18/2015  . Arteriosclerosis of coronary artery 01/05/2014  . Chronic obstructive pulmonary disease (HCC) 01/05/2014  . Essential (primary) hypertension 01/05/2014  . Acid reflux 01/05/2014  . Cerebrovascular  accident, old 01/05/2014  . Amnesia 01/05/2014  . Automatic implantable cardioverter-defibrillator in situ 01/05/2014    Past Surgical History:  Procedure Laterality Date  . A-V CARDIAC PACEMAKER INSERTION    . CARDIAC DEFIBRILLATOR PLACEMENT    . CATARACT EXTRACTION    . JOINT REPLACEMENT      OB History    No data available       Home Medications    Prior to Admission medications   Medication Sig Start Date End Date Taking? Authorizing Provider  amLODipine (NORVASC) 5 MG tablet Take 5 mg by mouth. 01/18/15   [provider]  aspirin 81 MG chewable tablet Chew 81 mg by mouth daily.    [provider]  cefTRIAXone 2 g in dextrose 5 % 50 mL Inject 2 g into the vein daily. 10/12/15   Richarda Overlie, MD  Cholecalciferol (VITAMIN D PO) Take 50 mg by mouth once.    [provider]  donepezil (ARICEPT) 5 MG tablet TAKE 1 TABLET (5 MG TOTAL) BY MOUTH NIGHTLY. 07/07/15   [provider]  furosemide (LASIX) 20 MG tablet Take 2 tablets (40 mg total) by mouth daily. 10/12/15   Richarda Overlie, MD  guaiFENesin (MUCINEX) 600 MG 12 hr tablet Take 1 tablet (600 mg total) by mouth 2 (two) times daily. 10/12/15   Richarda Overlie, MD  HYDROcodone-acetaminophen (NORCO/VICODIN) 5-325 MG tablet Take 1 tablet by mouth every 6 (six) hours as  needed for moderate pain. 10/12/15   Richarda Overlie, MD  hydrOXYzine (ATARAX/VISTARIL) 10 MG tablet Take 10 mg by mouth daily as needed for itching.    [provider]  isosorbide mononitrate (ISMO,MONOKET) 10 MG tablet TAKE 1 TABLET (10 MG TOTAL) BY MOUTH TWO (2) TIMES A DAY. 07/08/15   [provider]  nebivolol (BYSTOLIC) 2.5 MG tablet Take 2.5 mg by mouth daily.    [provider]  nitroGLYCERIN (NITROSTAT) 0.4 MG SL tablet Place 0.4 mg under the tongue every 5 (five) minutes as needed for chest pain.     [provider]  omeprazole (PRILOSEC) 20 MG capsule Take 20 mg by mouth daily.    [provider]  pravastatin (PRAVACHOL) 20 MG tablet TAKE 1 TABLET (20 MG TOTAL) BY MOUTH EVERY EVENING. 07/23/15   [provider]  Vancomycin (VANCOCIN) 750 MG/150ML SOLN Inject 150 mLs (750 mg total) into the vein daily. 10/12/15   Richarda Overlie, MD    Family History Family History  Problem Relation Age of Onset  . Hypertension Other     Social History Social History  Substance Use Topics  . Smoking status: Former Games developer  . Smokeless tobacco: Never Used  . Alcohol use No     Allergies   Penicillins   Review of Systems Review of Systems  Unable to perform ROS: Dementia     Physical Exam Updated Vital Signs BP (!) 129/53 (BP Location: Right Arm)   Pulse 67   Temp 98 F (36.7 C) (Oral)   Resp 16   Ht 5\' 5"  (1.651 m)   Wt 54 kg (119 lb)   SpO2 95%   BMI 19.80 kg/m   Physical Exam  Constitutional: She is oriented to person, place, and time. She appears well-developed and well-nourished.  HENT:  Head: Normocephalic and atraumatic.  Mouth/Throat: Oropharynx is clear and moist.  Eyes: Conjunctivae and EOM are normal. Pupils are equal, round, and reactive to light.  Neck: Normal range of motion.  Cardiovascular: Normal rate, regular rhythm and normal heart sounds.   Pulmonary/Chest: Effort normal and breath sounds normal.  Abdominal: Soft. Bowel sounds are normal.  Musculoskeletal: Normal range of motion.  5 cm irregular, L-shaped laceration of left mid shin, wound is hemostatic, there is no associated bony deformity or swelling Large amount of bruising to right lateral lower leg, appears in various stages, no bony deformity noted  Neurological: She is alert and oriented to person, place, and time.  Skin: Skin is warm and dry.  Psychiatric: She has a normal mood and affect.  Nursing note and vitals reviewed.    ED Treatments / Results  Labs (all labs ordered are listed, but only abnormal results are displayed) Labs Reviewed - No data to  display  EKG  EKG Interpretation None       Radiology Dg Tibia/fibula Left  Result Date: 11/25/2016 CLINICAL DATA:  Cut left lower leg. EXAM: LEFT TIBIA AND FIBULA - 2 VIEW COMPARISON:  None. FINDINGS: Diffuse osteopenia. No acute bony abnormality. Specifically, no fracture, subluxation, or dislocation. Soft tissues are intact. No radiopaque foreign bodies. IMPRESSION: No acute bony abnormality. Electronically Signed   By: Charlett Nose M.D.   On: 11/25/2016 23:48   Dg Tibia/fibula Right  Result Date: 11/25/2016 CLINICAL DATA:  Cut left lower leg.  Bruising in right lower leg. EXAM: RIGHT TIBIA AND FIBULA - 2 VIEW COMPARISON:  None. FINDINGS: Prior right knee replacement. Diffuse osteopenia. No acute bony abnormality. Specifically,  no fracture, subluxation, or dislocation. Soft tissues are intact. No radiopaque foreign bodies. IMPRESSION: No acute bony abnormality. Electronically Signed   By: Charlett NoseKevin  Dover M.D.   On: 11/25/2016 23:47    Procedures Procedures (including critical care time)  LACERATION REPAIR Performed by: Garlon HatchetSANDERS, LISA M Authorized by: Garlon HatchetSANDERS, LISA M Consent: Verbal consent obtained. Risks and benefits: risks, benefits and alternatives were discussed Consent given by: patient Patient identity confirmed: provided demographic data Prepped and Draped in normal sterile fashion Wound explored  Laceration Location: left shin  Laceration Length: 5cm, l shaped and irreglar  No Foreign Bodies seen or palpated  Anesthesia: local infiltration  Local anesthetic: lidocaine 1% with epinephrine  Anesthetic total: 5 ml  Irrigation method: syringe Amount of cleaning: standard  Skin closure: 4-0 vicryl  Number of sutures: 8  Technique: simple interrupted  Patient tolerance: Patient tolerated the procedure well with no immediate complications.   Medications Ordered in ED Medications  lidocaine (PF) (XYLOCAINE) 1 % injection 30 mL (30 mLs Intradermal Given 11/26/16  0000)  Tdap (BOOSTRIX) injection 0.5 mL (0.5 mLs Intramuscular Given 11/26/16 0031)     Initial Impression / Assessment and Plan / ED Course  I have reviewed the triage vital signs and the nursing notes.  Pertinent labs & imaging results that were available during my care of the patient were reviewed by me and considered in my medical decision making (see chart for details).  83 year old female here with left leg laceration after striking it against a metal plate on her mechanical lift chair. Wound is hemostatic at this time. She does have some bruising surrounding laceration, but also has a large amount of bruising to her right lower leg as well. Son was unaware of this prior to my evaluation. X-rays obtained, no acute bony findings. Tetanus was updated. Laceration repaired as above. Patient tolerated well. Can be discharged home. Will need suture removal in 7-10 days with PCP. Plan was discussed with patient's son at the bedside, they both acknowledged understanding and agreed with plan of care. Return precautions were given for any new or worsening symptoms.  Final Clinical Impressions(s) / ED Diagnoses   Final diagnoses:  Laceration of anterior tibial artery, left leg, initial encounter    New Prescriptions New Prescriptions   No medications on file     Garlon HatchetSanders, Lisa M, PA-C 11/26/16 0245    Rolland PorterJames, Mark, MD 12/08/16 [phone]

## 2016-11-26 NOTE — ED Notes (Signed)
PT is alert and verbally responsive, and has multiple bruises noted to bilateral legs and a laceration noted to the right lower leg. Pt son is at bedside.

## 2016-11-26 NOTE — Discharge Instructions (Signed)
You will need to have your stitches out in the next 7-10 days.  Your primary care doctor can do this for you. Keep sutures clean and dry.  Your nurses can help with your dressing changes. Return here if any new issues or concerns.

## 2016-11-26 NOTE — ED Notes (Signed)
[facility] called  

## 2017-02-24 DEATH — deceased

## 2017-03-13 IMAGING — CR DG CHEST 2V
2 series · 2 of 2 positions shown · non-contrast
Comparison: 11/22/2013

CLINICAL DATA: Pt fell 2 nights ago after unknown event, found
yesterday morning by nursing staff on floor at home. Pt remained on
floor for unknown period of time throughout the night. Pt checked by
EMS and given okay to not come to hospital. Since yesterday,
bruising to right wrist and lower arm has increased and pt c/o
sternal chest pain and right upper arm pain. Per son, patient ROM
limited in right arm since fall.

EXAM:
CHEST  2 VIEW

[w chest lat]
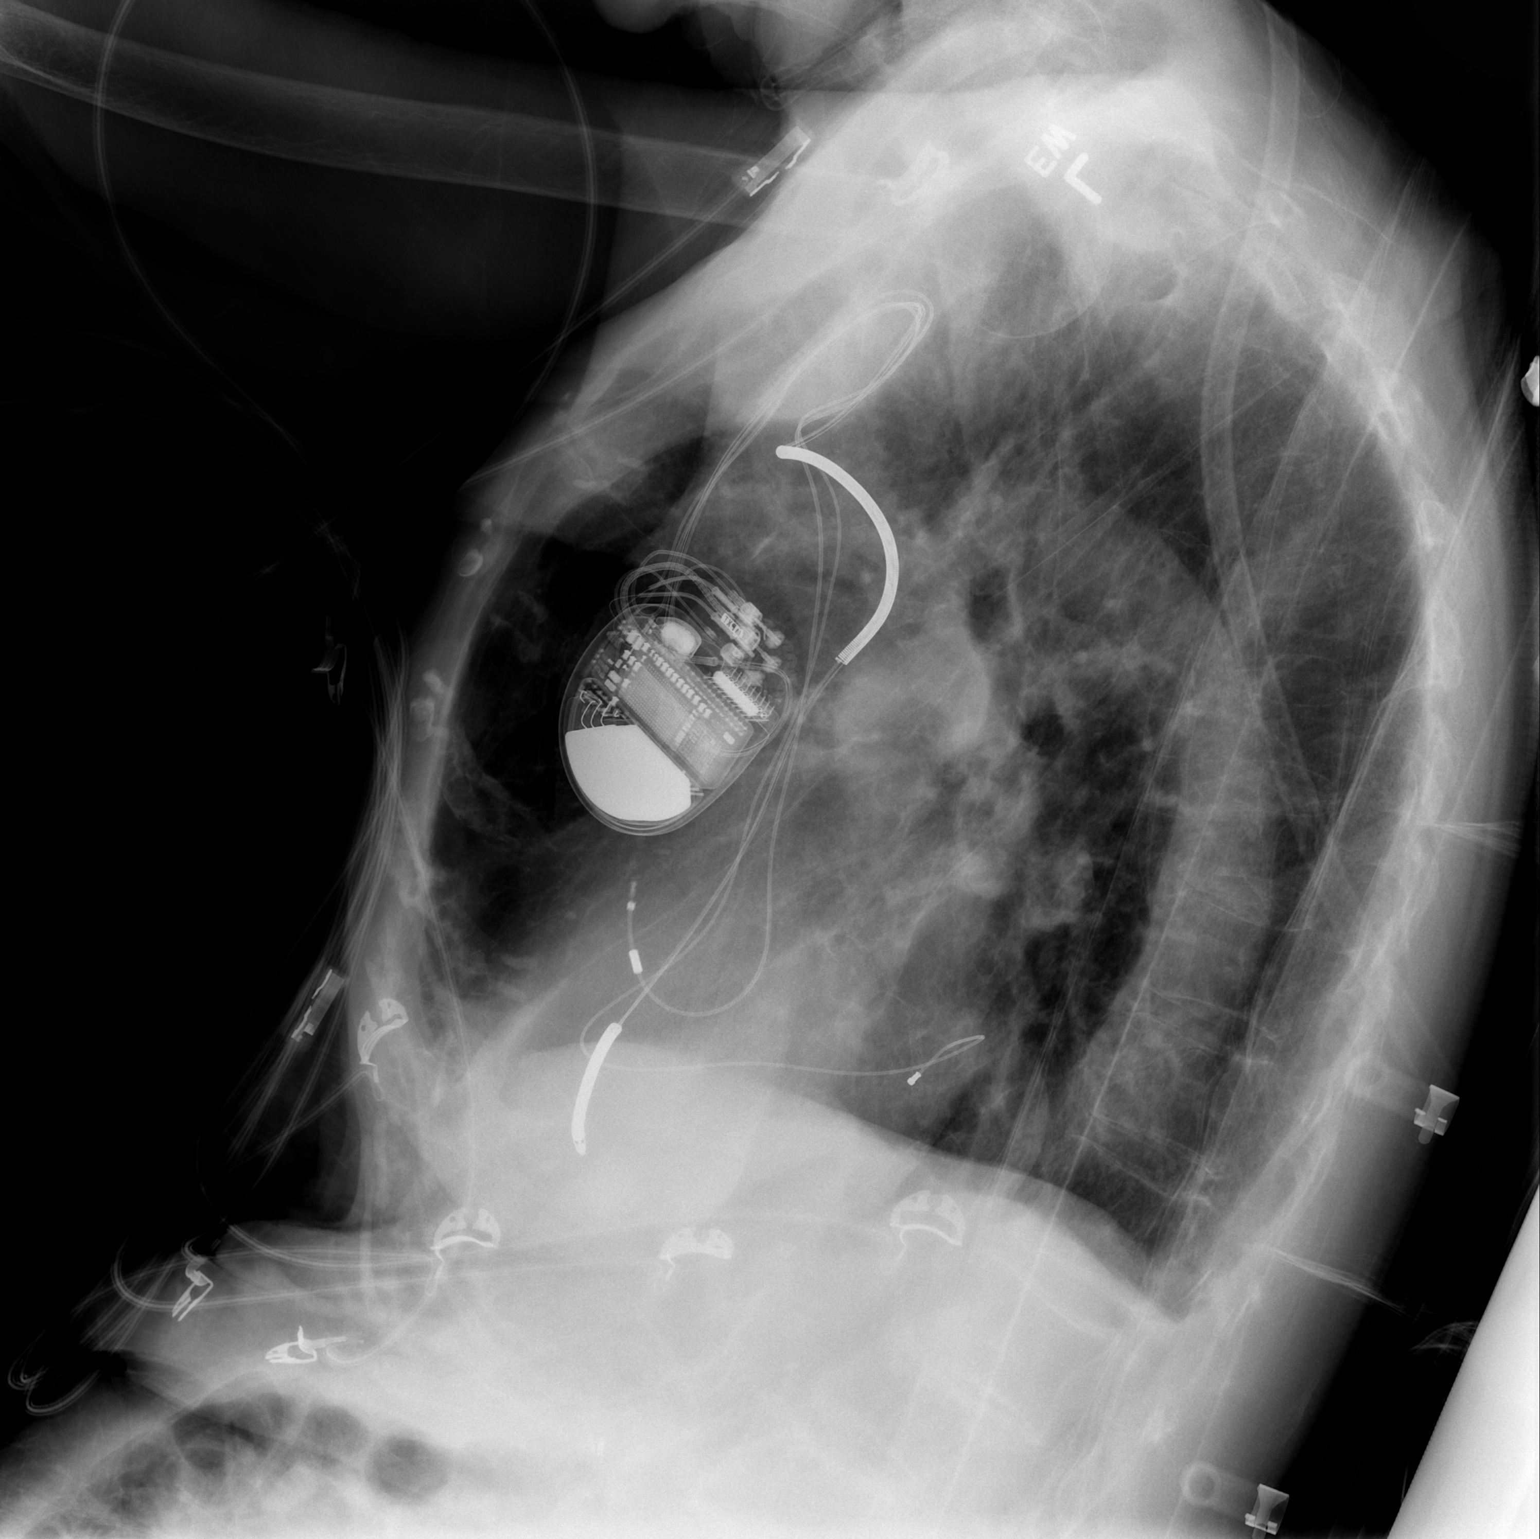

[w chest pa]
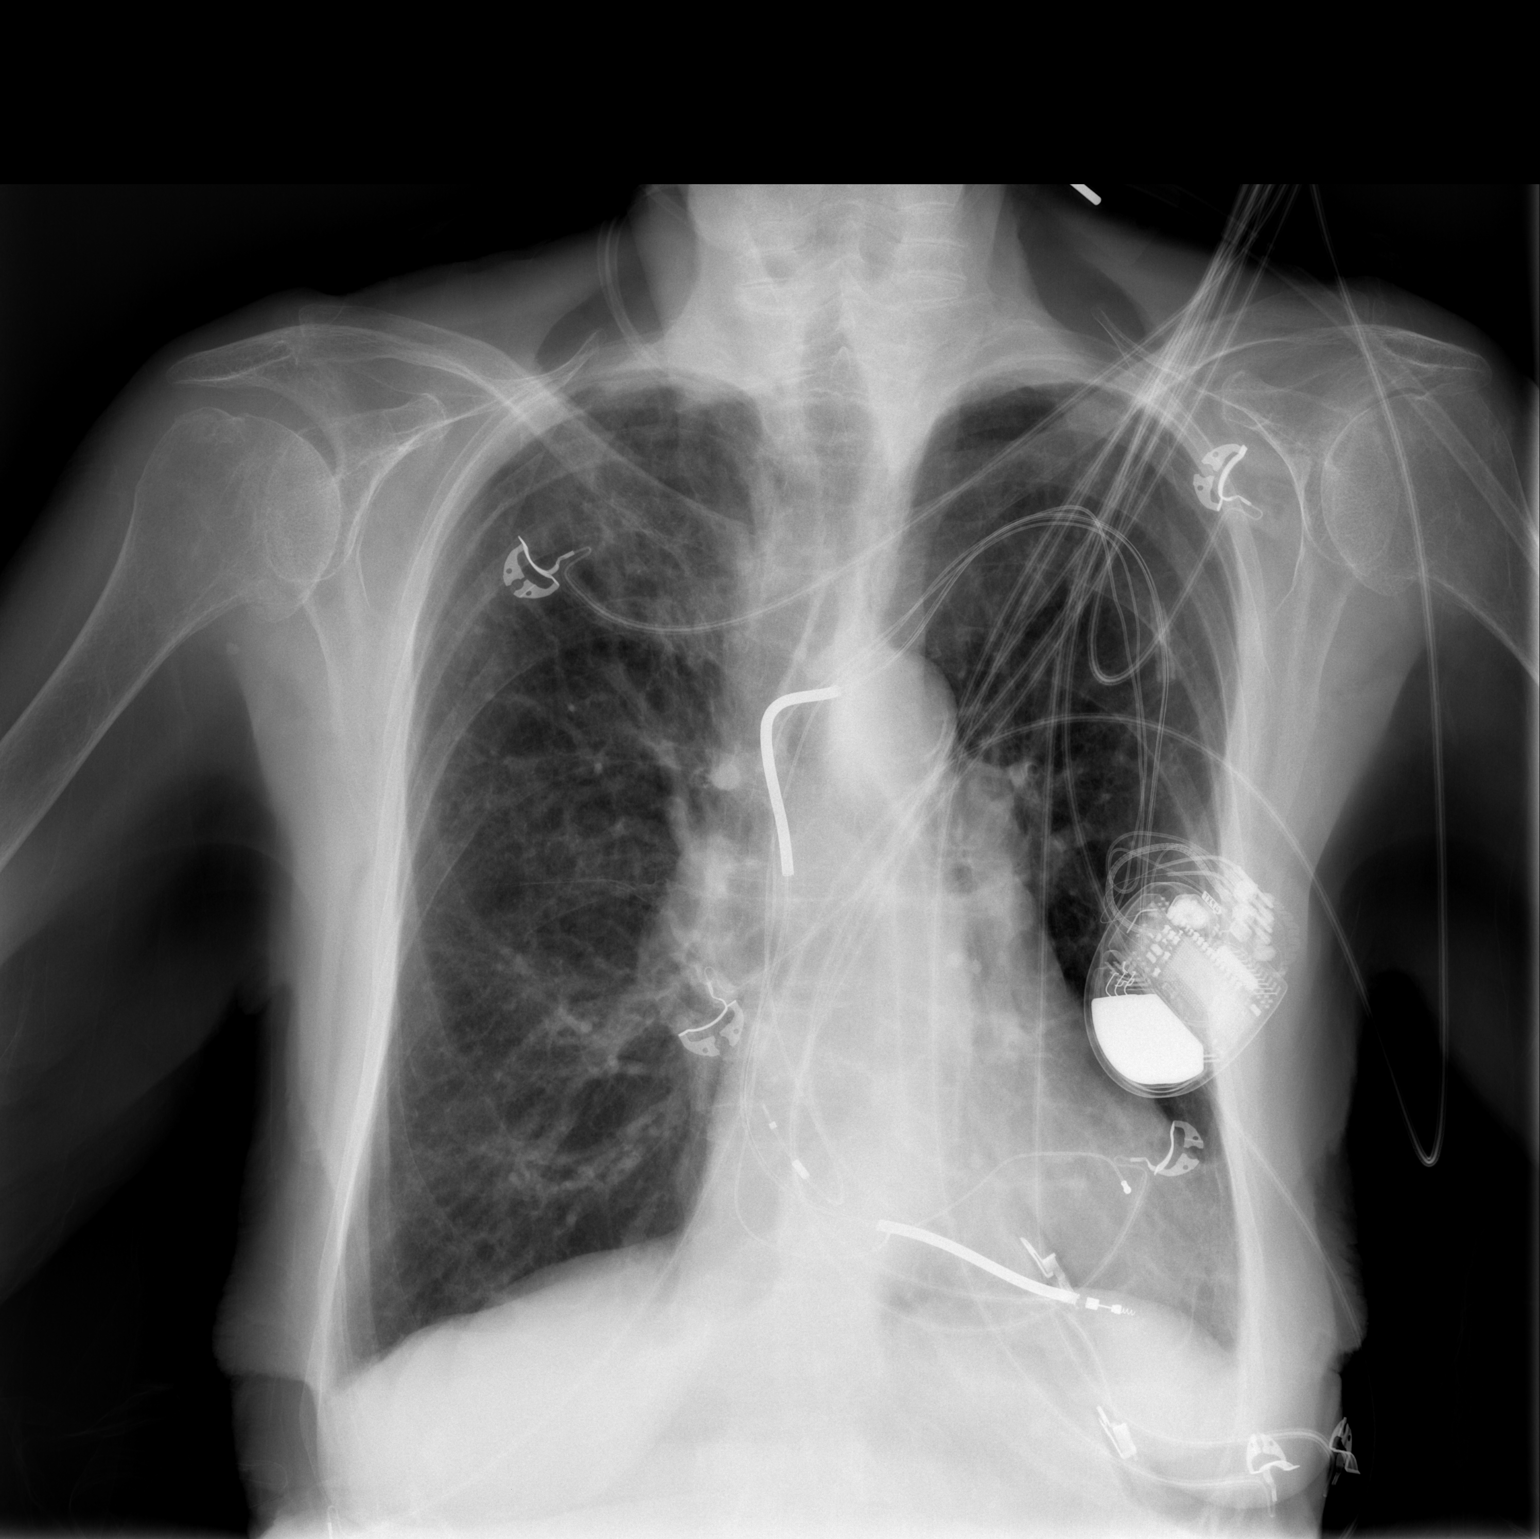

[2 of 2 positions shown; findings below may reference images not displayed]

FINDINGS: Cardiac silhouette is mildly enlarged. Left anterior chest wall
biventricular cardioverter-defibrillator is stable. No mediastinal
or hilar masses or evidence of adenopathy. Lungs are hyperexpanded.
No evidence of pneumonia or edema. No pleural effusion or
pneumothorax.

Bony thorax is demineralized but grossly intact.
IMPRESSION: No acute cardiopulmonary disease.

## 2017-03-13 IMAGING — CR DG WRIST COMPLETE 3+V*R*
4 series · 4 of 4 positions shown · non-contrast
Comparison: None.

CLINICAL DATA: Pain after fall

EXAM:
RIGHT WRIST - COMPLETE 3+ VIEW

[x wrist pa right]
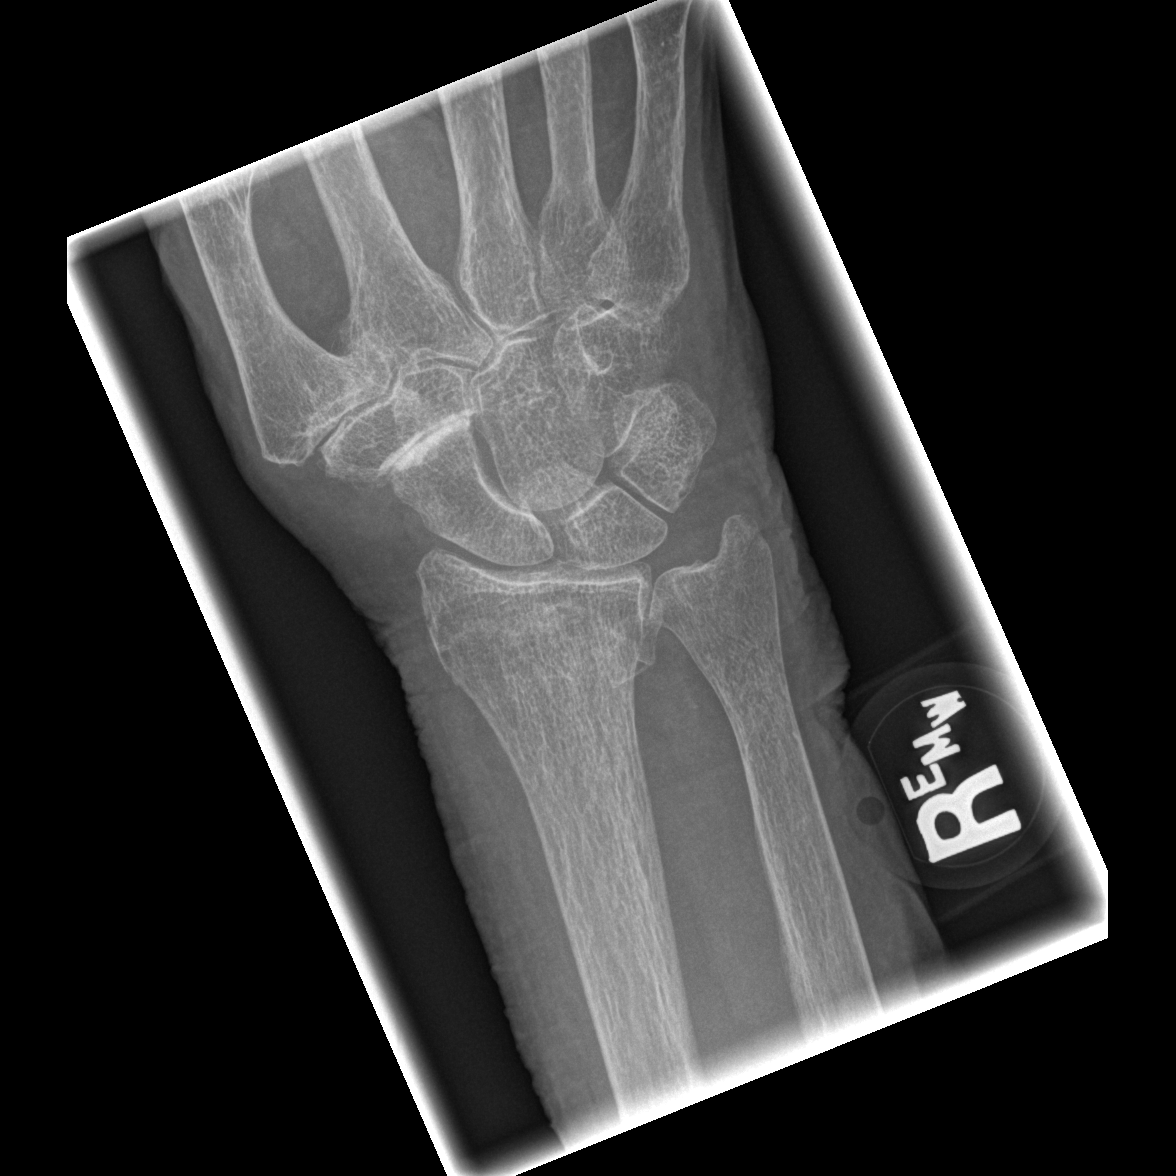

[x wrist obl right]
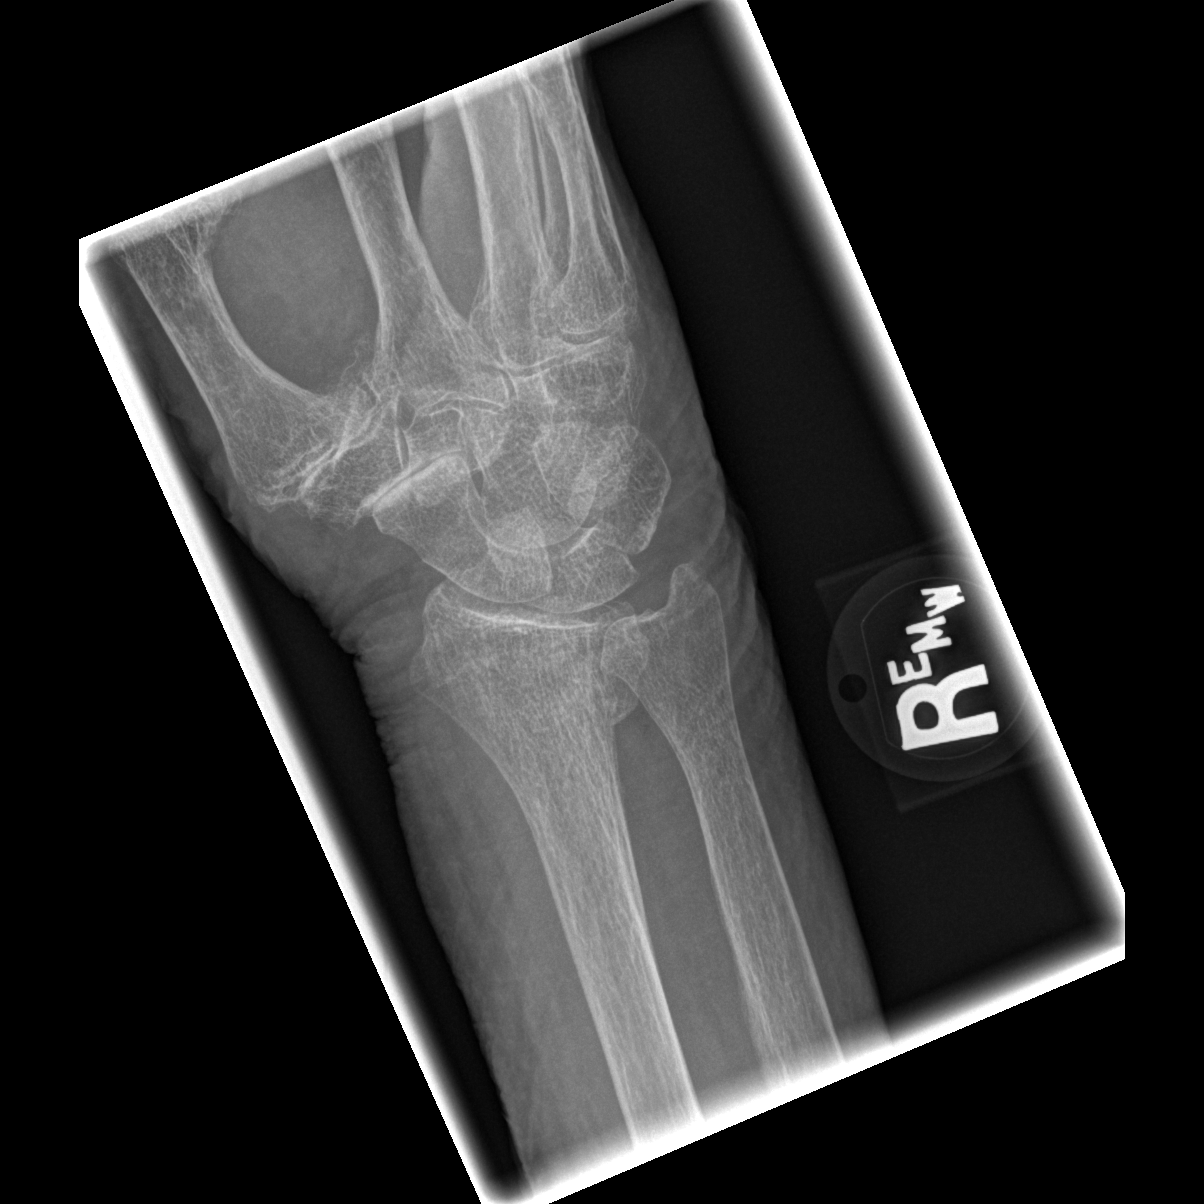

[x wrist lat right]
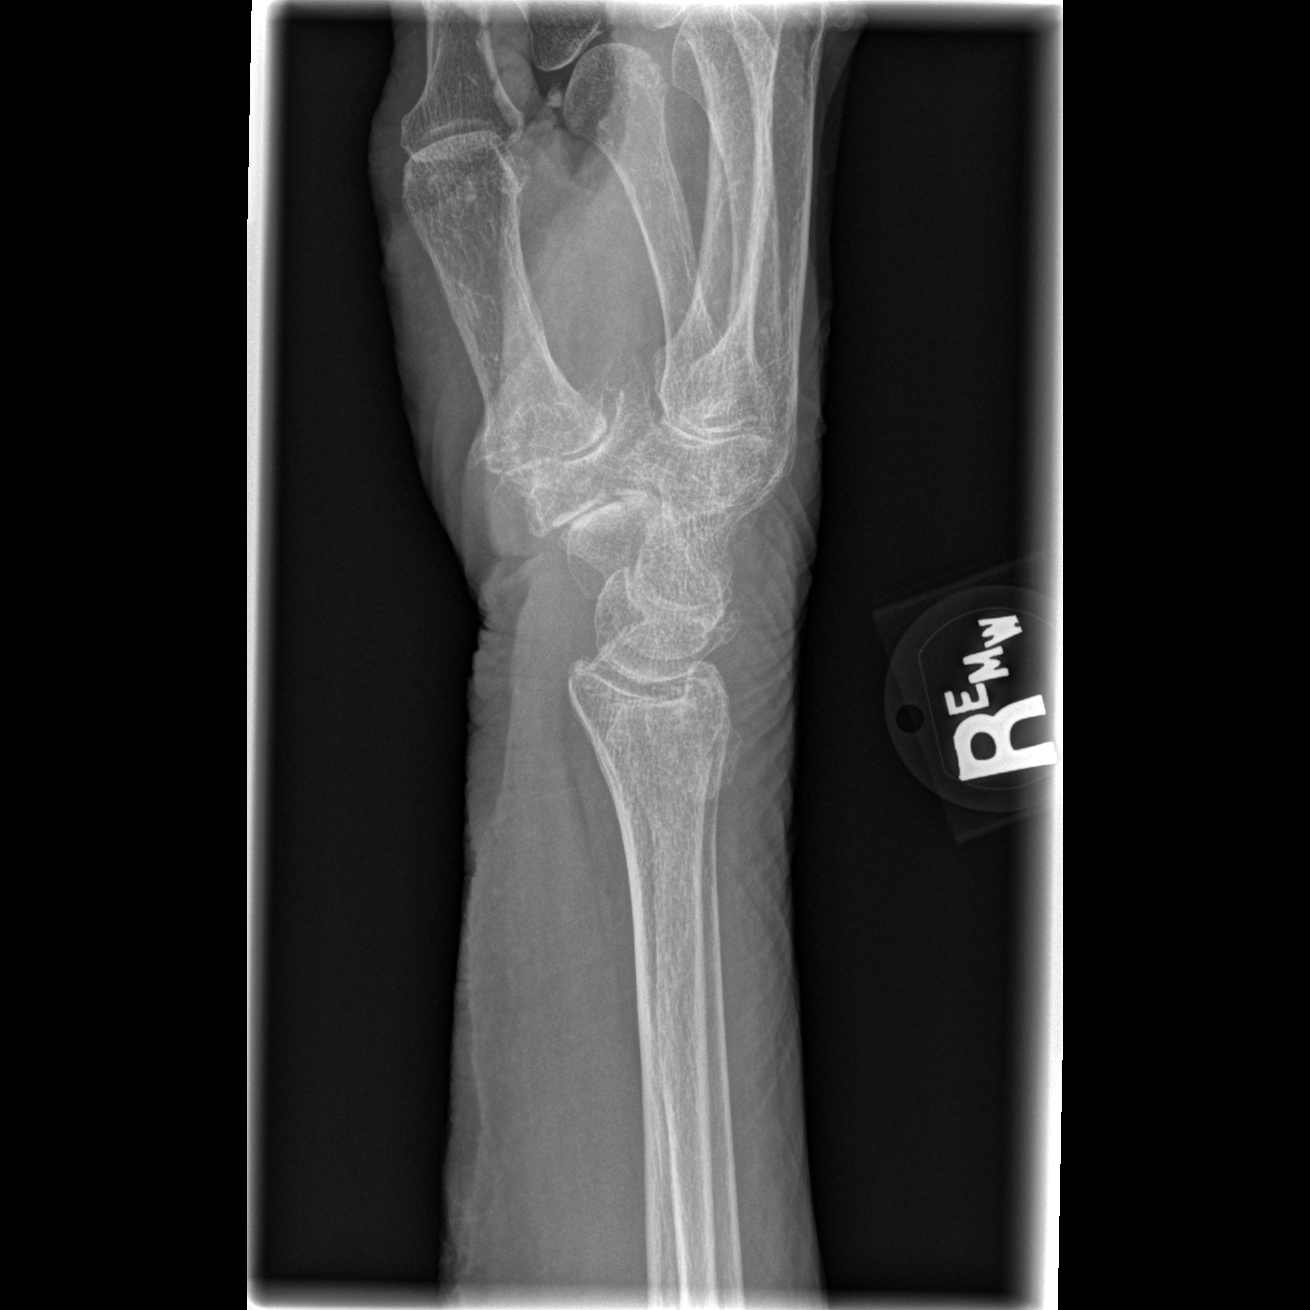

[x navicular]
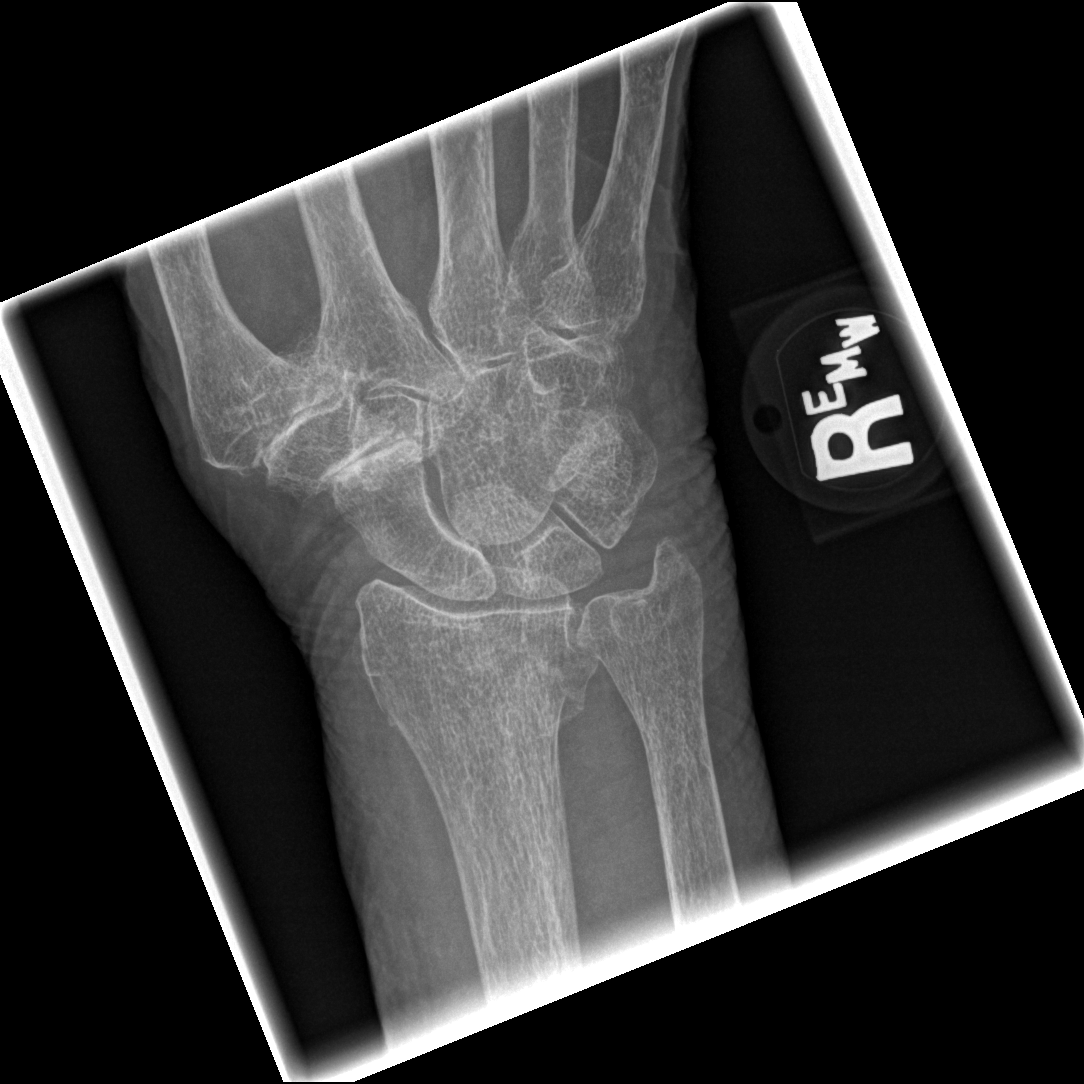

[4 of 4 positions shown; findings below may reference images not displayed]

FINDINGS: There is a nondisplaced fracture through the distal radius. The ulna
is normal in appearance. No wrist dislocation. The carpal bones and
visualized metacarpal bones are intact. Degenerative changes are
seen at the base of the first metacarpal.
IMPRESSION: Nondisplaced subtle distal radius fracture.
